# Patient Record
Sex: Female | Born: 1995 | Race: Black or African American | Hispanic: No | Smoking: Current some day smoker
Health system: Southern US, Community
[De-identification: ages and names within clinical notes are randomized; demographics above are authoritative.]

## PROBLEM LIST (undated history)

## (undated) DIAGNOSIS — E119 Type 2 diabetes mellitus without complications: Secondary | ICD-10-CM

## (undated) DIAGNOSIS — R87629 Unspecified abnormal cytological findings in specimens from vagina: Secondary | ICD-10-CM

## (undated) DIAGNOSIS — Z789 Other specified health status: Secondary | ICD-10-CM

## (undated) HISTORY — DX: Unspecified abnormal cytological findings in specimens from vagina: R87.629

## (undated) HISTORY — DX: Type 2 diabetes mellitus without complications: E11.9

## (undated) HISTORY — PX: WISDOM TOOTH EXTRACTION: SHX21

## (undated) HISTORY — PX: NO PAST SURGERIES: SHX2092

---
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
## 2015-12-22 ENCOUNTER — Ambulatory Visit (INDEPENDENT_AMBULATORY_CARE_PROVIDER_SITE_OTHER): Payer: BLUE CROSS/BLUE SHIELD

## 2015-12-22 ENCOUNTER — Ambulatory Visit (HOSPITAL_COMMUNITY): Payer: BLUE CROSS/BLUE SHIELD

## 2015-12-22 ENCOUNTER — Ambulatory Visit (HOSPITAL_COMMUNITY)
Admission: EM | Admit: 2015-12-22 | Discharge: 2015-12-22 | Disposition: A | Discharge status: Home / Self Care | Payer: BLUE CROSS/BLUE SHIELD | Attending: Emergency Medicine | Admitting: Emergency Medicine

## 2015-12-22 ENCOUNTER — Encounter (HOSPITAL_COMMUNITY): Payer: Self-pay | Admitting: Emergency Medicine

## 2015-12-22 DIAGNOSIS — S93491A Sprain of other ligament of right ankle, initial encounter: Secondary | ICD-10-CM

## 2015-12-22 MED ORDER — NAPROXEN 500 MG PO TABS: 500.0000 mg | ORAL_TABLET | Freq: Two times a day (BID) | ORAL | 0 refills | Status: DC | PRN

## 2015-12-22 NOTE — ED Triage Notes (Signed)
Stepped in a hole yesterday and today pain has increased significantly.  Pain in foot and ankle.  Reports numbness in toes.

## 2015-12-22 NOTE — Discharge Instructions (Signed)
Keep ankle in ankle brace for support. Elevated foot and apply ice tonight. Use crutches for support. Take Naproxen twice a day as needed for pain and swelling. Follow-up in 3 days if not improving.

## 2015-12-22 NOTE — ED Provider Notes (Signed)
CSN: [id number]     Arrival date & time 12/22/15  1455 History   None    Chief Complaint  Patient presents with  . Ankle Pain   (Consider location/radiation/quality/duration/timing/severity/associated sxs/prior Treatment) 20 year old female presents with right ankle and foot pain after injury yesterday evening. She stepped in a hole yesterday and twisted her ankle. Pain and swelling have increased today and unable to bear weight. Has not taken any medication for pain. No previous injury to her foot/ankle. No chronic health issues.    The history is provided by the patient.    History reviewed. No pertinent past medical history. History reviewed. No pertinent surgical history. No family history on file. Social History  Substance Use Topics  . Smoking status: Current Some Day Smoker  . Smokeless tobacco: Never Used  . Alcohol use Yes   OB History    No data available     Review of Systems  Constitutional: Negative for fever.  Musculoskeletal: Positive for arthralgias and joint swelling.  Skin: Positive for color change. Negative for rash.  Allergic/Immunologic: Negative for immunocompromised state.  Neurological: Negative for dizziness, weakness, light-headedness, numbness and headaches.  Hematological: Does not bruise/bleed easily.    Allergies  Review of patient's allergies indicates no known allergies.  Home Medications   Prior to Admission medications   Medication Sig Start Date End Date Taking? Authorizing Provider  naproxen (NAPROSYN) 500 MG tablet Take 1 tablet (500 mg total) by mouth 2 (two) times daily as needed. 12/22/15   Sudie GrumblingAnn Berry Shakeda Pearse, NP  TRI-LO-SPRINTEC 0.18/0.215/0.25 MG-25 MCG tab Take 1 tablet by mouth daily. 11/15/14   Historical Provider, MD   Meds Ordered and Administered this Visit  Medications - No data to display  BP 123/60 (BP Location: Right Arm)   Pulse 73   Temp 98.6 F (37 C) (Oral)   LMP 12/10/2015   SpO2 100%  No data  found.   Physical Exam  Constitutional: She is oriented to person, place, and time. She appears well-developed and well-nourished. No distress.  Cardiovascular: Normal rate and regular rhythm.   Musculoskeletal: She exhibits edema and tenderness.       Right ankle: She exhibits decreased range of motion, swelling and ecchymosis. She exhibits no deformity, no laceration and normal pulse. Tenderness. Lateral malleolus tenderness found. Achilles tendon normal.       Feet:  Has decreased range of motion of right ankle and foot, particularly with flexion and extension. Tender below the lateral malleolus and dorsal aspect of foot. Minimal bruising seen. Good pulses and capillary refill. No numbness of toes or neuro deficits noted.   Neurological: She is alert and oriented to person, place, and time. She has normal strength. No sensory deficit.  Skin: Skin is warm and dry. Capillary refill takes less than 2 seconds.  Psychiatric: She has a normal mood and affect. Her behavior is normal. Judgment and thought content normal.    Urgent Care Course   Clinical Course    Procedures (including critical care time)  Labs Review Labs Reviewed - No data to display  Imaging Review Dg Ankle Complete Right  Result Date: 12/22/2015 CLINICAL DATA:  Stepped in hole today. Right ankle injury and pain. Initial encounter. EXAM: RIGHT ANKLE - COMPLETE 3+ VIEW COMPARISON:  None. FINDINGS: There is no evidence of fracture, dislocation, or joint effusion. There is no evidence of arthropathy or other focal bone abnormality. Soft tissues are unremarkable. IMPRESSION: Negative. Electronically Signed   By: Jonny RuizJohn  Eppie GibsonStahl M.D.   On: 12/22/2015 17:15     Visual Acuity Review  Right Eye Distance:   Left Eye Distance:   Bilateral Distance:    Right Eye Near:   Left Eye Near:    Bilateral Near:         MDM   1. Sprain of anterior talofibular ligament of right ankle, initial encounter    Recommend wear ankle  brace and use crutches for support. Take Naproxen 500mg  twice a day as needed for pain and swelling. Elevated foot as much as possible. Follow-up in 4 to 5 days if not improving.       Sudie GrumblingAnn Berry Chriss Mannan, NP 12/23/15 [phone]

## 2016-03-13 ENCOUNTER — Encounter (HOSPITAL_COMMUNITY): Payer: Self-pay

## 2016-03-13 ENCOUNTER — Emergency Department (HOSPITAL_COMMUNITY)
Admission: EM | Admit: 2016-03-13 | Discharge: 2016-03-13 | Disposition: A | Discharge status: Home / Self Care | Payer: BLUE CROSS/BLUE SHIELD | Attending: Emergency Medicine | Admitting: Emergency Medicine

## 2016-03-13 DIAGNOSIS — J069 Acute upper respiratory infection, unspecified: Secondary | ICD-10-CM | POA: Diagnosis not present

## 2016-03-13 DIAGNOSIS — F17200 Nicotine dependence, unspecified, uncomplicated: Secondary | ICD-10-CM | POA: Diagnosis not present

## 2016-03-13 DIAGNOSIS — Z79899 Other long term (current) drug therapy: Secondary | ICD-10-CM | POA: Diagnosis not present

## 2016-03-13 DIAGNOSIS — R509 Fever, unspecified: Secondary | ICD-10-CM | POA: Diagnosis present

## 2016-03-13 MED ORDER — ONDANSETRON 4 MG PO TBDP: ORAL_TABLET | ORAL | 0 refills | Status: DC

## 2016-03-13 MED ORDER — ONDANSETRON 4 MG PO TBDP
4.0000 mg | ORAL_TABLET | Freq: Once | ORAL | Status: AC
  Administered 2016-03-13: 4 mg via ORAL
  Filled 2016-03-13: qty 1

## 2016-03-13 MED ORDER — PROMETHAZINE-DM 6.25-15 MG/5ML PO SYRP: 5.0000 mL | ORAL_SOLUTION | Freq: Four times a day (QID) | ORAL | 0 refills | Status: DC | PRN

## 2016-03-13 MED ORDER — ACETAMINOPHEN 325 MG PO TABS
650.0000 mg | ORAL_TABLET | Freq: Once | ORAL | Status: AC
Start: 2016-03-13 — End: 2016-03-13
  Administered 2016-03-13: 650 mg via ORAL
  Filled 2016-03-13: qty 2

## 2016-03-13 NOTE — ED Notes (Signed)
See EDP assessment 

## 2016-03-13 NOTE — ED Provider Notes (Signed)
[facility]-EMERGENCY DEPT Provider Note    By signing my name below, I, Earmon PhoenixJennifer Waddell, attest that this documentation has been prepared under the direction and in the presence of Felicie Mornavid Lawrence Roldan, FNP. Electronically Signed: Earmon PhoenixJennifer Waddell, ED Scribe. 03/13/16. 9:29 PM.    History   Chief Complaint Chief Complaint  Patient presents with  . flu    The history is provided by the patient and medical records. No language interpreter was used.    Sharnetta Claudette LawsWatson is an obese 21 y.o. female who presents to the Emergency Department complaining of subjective fever and chills that began last night. She reports associated sore throat, mildly productive cough, nausea, vomiting x 3 episodes today, abdominal pain, generalized body aches, nasal congestion and HA. She has not taken anything for her symptoms. Pt reports sick contacts from work. Pt denies modifying factors. She denies difficulty breathing or swallowing. She does not have a PCP locally.   History reviewed. No pertinent past medical history.  There are no active problems to display for this patient.   History reviewed. No pertinent surgical history.  OB History    No data available       Home Medications    Prior to Admission medications   Medication Sig Start Date End Date Taking? Authorizing Provider  naproxen (NAPROSYN) 500 MG tablet Take 1 tablet (500 mg total) by mouth 2 (two) times daily as needed. 12/22/15   Sudie GrumblingAnn Berry Amyot, NP  TRI-LO-SPRINTEC 0.18/0.215/0.25 MG-25 MCG tab Take 1 tablet by mouth daily. 11/15/14   Historical Provider, MD    Family History No family history on file.  Social History Social History  Substance Use Topics  . Smoking status: Current Some Day Smoker  . Smokeless tobacco: Never Used  . Alcohol use Yes     Allergies   Patient has no known allergies.   Review of Systems Review of Systems  Constitutional: Positive for chills and fever (subjective).  HENT: Positive for congestion and sore  throat. Negative for trouble swallowing.   Respiratory: Positive for cough. Negative for shortness of breath.   Gastrointestinal: Positive for abdominal pain, nausea and vomiting.  Musculoskeletal: Positive for myalgias.  Neurological: Positive for headaches.  All other systems reviewed and are negative.    Physical Exam Updated Vital Signs BP 127/75 (BP Location: Left Arm)   Pulse 75   Temp 99.3 F (37.4 C) (Oral)   Resp 20   Ht 5\' 2"  (1.575 m)   Wt 188 lb 6 oz (85.4 kg)   LMP 03/04/2016   SpO2 100%   BMI 34.45 kg/m   Physical Exam  Constitutional: She is oriented to person, place, and time. She appears well-developed and well-nourished.  HENT:  Head: Normocephalic and atraumatic.  Right Ear: Tympanic membrane and ear canal normal.  Left Ear: Tympanic membrane and ear canal normal.  Nose: Rhinorrhea present.  Mouth/Throat: Uvula is midline and mucous membranes are normal. Posterior oropharyngeal erythema present.  Neck: Normal range of motion.  Cardiovascular: Normal rate, regular rhythm and normal heart sounds.  Exam reveals no gallop and no friction rub.   No murmur heard. Pulmonary/Chest: Effort normal and breath sounds normal. No respiratory distress. She has no wheezes. She has no rales.  Musculoskeletal: Normal range of motion.  Neurological: She is alert and oriented to person, place, and time.  Skin: Skin is warm and dry.  Psychiatric: She has a normal mood and affect. Her behavior is normal.  Nursing note and vitals reviewed.  ED Treatments / Results  DIAGNOSTIC STUDIES: Oxygen Saturation is 100% on RA, normal by my interpretation.   COORDINATION OF CARE: 9:25 PM- Will treat symptomatically. Advised pt to take OTC Tylenol or Motrin for fever/body aches. Return precautions discussed. Pt verbalizes understanding and agrees to plan.  Medications - No data to display  Labs (all labs ordered are listed, but only abnormal results are displayed) Labs  Reviewed - No data to display  EKG  EKG Interpretation None       Radiology No results found.  Procedures Procedures (including critical care time)  Medications Ordered in ED Medications - No data to display   Initial Impression / Assessment and Plan / ED Course  I have reviewed the triage vital signs and the nursing notes.  Pertinent labs & imaging results that were available during my care of the patient were reviewed by me and considered in my medical decision making (see chart for details).     Pt symptoms consistent with URI. Pt will be discharged with symptomatic treatment.  Discussed return precautions. Pt is hemodynamically stable & in NAD prior to discharge.   I personally performed the services described in this documentation, which was scribed in my presence. The recorded information has been reviewed and is accurate.    Final Clinical Impressions(s) / ED Diagnoses   Final diagnoses:  Viral URI    New Prescriptions Discharge Medication List as of 03/13/2016 10:05 PM    START taking these medications   Details  ondansetron (ZOFRAN ODT) 4 MG disintegrating tablet 4mg  ODT q4 hours prn nausea/vomit, Print    promethazine-dextromethorphan (PROMETHAZINE-DM) 6.25-15 MG/5ML syrup Take 5 mLs by mouth 4 (four) times daily as needed for cough., Starting Thu 03/13/2016, Print         Felicie Morn, NP 03/13/16 2317    Linwood Dibbles, MD 03/14/16 1247

## 2016-03-13 NOTE — ED Triage Notes (Signed)
Pt states that last night she started having chills, fevers, sore throat, abd pain, headache and vomiting.

## 2016-10-15 ENCOUNTER — Encounter (HOSPITAL_COMMUNITY): Payer: Self-pay | Admitting: Emergency Medicine

## 2016-10-15 ENCOUNTER — Emergency Department (HOSPITAL_COMMUNITY)
Admission: EM | Admit: 2016-10-15 | Discharge: 2016-10-15 | Disposition: A | Discharge status: Home / Self Care | Payer: BLUE CROSS/BLUE SHIELD | Attending: Emergency Medicine | Admitting: Emergency Medicine

## 2016-10-15 DIAGNOSIS — Z5321 Procedure and treatment not carried out due to patient leaving prior to being seen by health care provider: Secondary | ICD-10-CM | POA: Diagnosis not present

## 2016-10-15 DIAGNOSIS — R1030 Lower abdominal pain, unspecified: Secondary | ICD-10-CM | POA: Insufficient documentation

## 2016-10-15 LAB — URINALYSIS, ROUTINE W REFLEX MICROSCOPIC
BILIRUBIN URINE: NEGATIVE
Glucose, UA: NEGATIVE mg/dL
HGB URINE DIPSTICK: NEGATIVE
KETONES UR: 5 mg/dL — AB
Leukocytes, UA: NEGATIVE
NITRITE: NEGATIVE
PROTEIN: NEGATIVE mg/dL
Specific Gravity, Urine: 1.027 (ref 1.005–1.030)
pH: 5 (ref 5.0–8.0)

## 2016-10-15 LAB — COMPREHENSIVE METABOLIC PANEL
ALBUMIN: 3.4 g/dL — AB (ref 3.5–5.0)
ALK PHOS: 59 U/L (ref 38–126)
ALT: 21 U/L (ref 14–54)
ANION GAP: 8 (ref 5–15)
AST: 23 U/L (ref 15–41)
BILIRUBIN TOTAL: 0.6 mg/dL (ref 0.3–1.2)
BUN: 13 mg/dL (ref 6–20)
CALCIUM: 9 mg/dL (ref 8.9–10.3)
CO2: 23 mmol/L (ref 22–32)
CREATININE: 0.74 mg/dL (ref 0.44–1.00)
Chloride: 104 mmol/L (ref 101–111)
GFR calc non Af Amer: >60 mL/min (ref >60)
GLUCOSE: 86 mg/dL (ref 65–99)
Potassium: 3.6 mmol/L (ref 3.5–5.1)
SODIUM: 135 mmol/L (ref 135–145)
TOTAL PROTEIN: 7.2 g/dL (ref 6.5–8.1)

## 2016-10-15 LAB — CBC
HCT: 35.6 % — ABNORMAL LOW (ref 36.0–46.0)
Hemoglobin: 11.6 g/dL — ABNORMAL LOW (ref 12.0–15.0)
MCH: 28.2 pg (ref 26.0–34.0)
MCHC: 32.6 g/dL (ref 30.0–36.0)
MCV: 86.6 fL (ref 78.0–100.0)
PLATELETS: 250 10*3/uL (ref 150–400)
RBC: 4.11 MIL/uL (ref 3.87–5.11)
RDW: 14.7 % (ref 11.5–15.5)
WBC: 9.7 10*3/uL (ref 4.0–10.5)

## 2016-10-15 LAB — HCG, QUANTITATIVE, PREGNANCY: HCG, BETA CHAIN, QUANT, S: 385 m[IU]/mL — AB (ref <5)

## 2016-10-15 LAB — LIPASE, BLOOD: Lipase: 30 U/L (ref 11–51)

## 2016-10-15 NOTE — ED Notes (Signed)
Patient up to desk.  States she does not want to wait.  This RN asked if she could review patient's chart, patient' states "no thank you" and left through front doors.

## 2016-10-15 NOTE — ED Notes (Signed)
Called for pt x3 with no response 

## 2016-10-15 NOTE — ED Triage Notes (Signed)
Reports n/v/d for three days as well as lower abdominal cramping.  States I'm not sure if I am pregnant or not.

## 2016-10-16 ENCOUNTER — Emergency Department (HOSPITAL_COMMUNITY): Payer: BLUE CROSS/BLUE SHIELD

## 2016-10-16 ENCOUNTER — Encounter (HOSPITAL_COMMUNITY): Payer: Self-pay

## 2016-10-16 ENCOUNTER — Emergency Department (HOSPITAL_COMMUNITY)
Admission: EM | Admit: 2016-10-16 | Discharge: 2016-10-16 | Disposition: A | Discharge status: Home / Self Care | Payer: BLUE CROSS/BLUE SHIELD | Attending: Physician Assistant | Admitting: Physician Assistant

## 2016-10-16 DIAGNOSIS — Z7983 Long term (current) use of bisphosphonates: Secondary | ICD-10-CM | POA: Diagnosis not present

## 2016-10-16 DIAGNOSIS — O26891 Other specified pregnancy related conditions, first trimester: Secondary | ICD-10-CM | POA: Diagnosis not present

## 2016-10-16 DIAGNOSIS — Z79899 Other long term (current) drug therapy: Secondary | ICD-10-CM | POA: Diagnosis not present

## 2016-10-16 DIAGNOSIS — F17210 Nicotine dependence, cigarettes, uncomplicated: Secondary | ICD-10-CM | POA: Diagnosis not present

## 2016-10-16 DIAGNOSIS — Z3A01 Less than 8 weeks gestation of pregnancy: Secondary | ICD-10-CM | POA: Insufficient documentation

## 2016-10-16 DIAGNOSIS — R1030 Lower abdominal pain, unspecified: Secondary | ICD-10-CM | POA: Diagnosis present

## 2016-10-16 DIAGNOSIS — R109 Unspecified abdominal pain: Secondary | ICD-10-CM

## 2016-10-16 LAB — CBC
HEMATOCRIT: 36.5 % (ref 36.0–46.0)
HEMOGLOBIN: 11.9 g/dL — AB (ref 12.0–15.0)
MCH: 28.5 pg (ref 26.0–34.0)
MCHC: 32.6 g/dL (ref 30.0–36.0)
MCV: 87.3 fL (ref 78.0–100.0)
Platelets: 239 10*3/uL (ref 150–400)
RBC: 4.18 MIL/uL (ref 3.87–5.11)
RDW: 14.6 % (ref 11.5–15.5)
WBC: 9.4 10*3/uL (ref 4.0–10.5)

## 2016-10-16 LAB — COMPREHENSIVE METABOLIC PANEL
ALT: 20 U/L (ref 14–54)
ANION GAP: 8 (ref 5–15)
AST: 23 U/L (ref 15–41)
Albumin: 3.2 g/dL — ABNORMAL LOW (ref 3.5–5.0)
Alkaline Phosphatase: 62 U/L (ref 38–126)
BILIRUBIN TOTAL: 0.5 mg/dL (ref 0.3–1.2)
BUN: 10 mg/dL (ref 6–20)
CHLORIDE: 106 mmol/L (ref 101–111)
CO2: 25 mmol/L (ref 22–32)
Calcium: 9.2 mg/dL (ref 8.9–10.3)
Creatinine, Ser: 0.61 mg/dL (ref 0.44–1.00)
GFR calc Af Amer: >60 mL/min (ref >60)
Glucose, Bld: 94 mg/dL (ref 65–99)
POTASSIUM: 3.6 mmol/L (ref 3.5–5.1)
Sodium: 139 mmol/L (ref 135–145)
TOTAL PROTEIN: 7.2 g/dL (ref 6.5–8.1)

## 2016-10-16 LAB — URINALYSIS, ROUTINE W REFLEX MICROSCOPIC
Bilirubin Urine: NEGATIVE
GLUCOSE, UA: NEGATIVE mg/dL
Hgb urine dipstick: NEGATIVE
KETONES UR: 5 mg/dL — AB
LEUKOCYTES UA: NEGATIVE
NITRITE: NEGATIVE
PH: 5 (ref 5.0–8.0)
Protein, ur: NEGATIVE mg/dL
SPECIFIC GRAVITY, URINE: 1.027 (ref 1.005–1.030)

## 2016-10-16 LAB — I-STAT BETA HCG BLOOD, ED (MC, WL, AP ONLY): I-stat hCG, quantitative: 451.7 m[IU]/mL — ABNORMAL HIGH (ref <5)

## 2016-10-16 LAB — ABO/RH: ABO/RH(D): A POS

## 2016-10-16 LAB — HCG, QUANTITATIVE, PREGNANCY: hCG, Beta Chain, Quant, S: 517 m[IU]/mL — ABNORMAL HIGH (ref <5)

## 2016-10-16 MED ORDER — PRENATAL COMPLETE 14-0.4 MG PO TABS: 1.0000 | ORAL_TABLET | ORAL | 0 refills | Status: AC

## 2016-10-16 NOTE — Discharge Instructions (Signed)
Please read attached information. If you experience any new or worsening signs or symptoms please return to the emergency room for evaluation. Please follow-up with your primary care provider or specialist as discussed. Please use medication prescribed only as directed and discontinue taking if you have any concerning signs or symptoms.   °

## 2016-10-16 NOTE — ED Provider Notes (Signed)
[facility]-EMERGENCY DEPT Provider Note   CSN: [id number] Arrival date & time: 10/16/16  1241     History   Chief Complaint Chief Complaint  Patient presents with  . Possible Pregnancy    HPI Yolanda Martinez is a 21 y.o. female.  HPI   21 year old female presents today with complaints of abdominal cramping and potential pregnancy.  Patient is a poor historian, she is unable to recall her last menstrual cycle.  She is not sure if it was in June or July.  Patient notes that 4 days ago she started developing abdominal cramping in the lower abdomen and pelvis.  Patient notes she has been feeling nauseous, but has not had any vomiting today.  Patient did have vomiting originally when symptoms started.  Patient denies any vaginal discharge or bleeding.  Patient not taking any medications.    History reviewed. No pertinent past medical history.  There are no active problems to display for this patient.   History reviewed. No pertinent surgical history.  OB History    No data available       Home Medications    Prior to Admission medications   Medication Sig Start Date End Date Taking? Authorizing Provider  naproxen (NAPROSYN) 500 MG tablet Take 1 tablet (500 mg total) by mouth 2 (two) times daily as needed. 12/22/15   Sudie Grumbling, NP  ondansetron (ZOFRAN ODT) 4 MG disintegrating tablet 4mg  ODT q4 hours prn nausea/vomit 03/13/16   Felicie Morn, NP  Prenatal Vit-Fe Fumarate-FA (PRENATAL COMPLETE) 14-0.4 MG TABS Take 1 tablet by mouth 1 day or 1 dose. 10/16/16 10/17/16  Idania Desouza, Tinnie Gens, PA-C  promethazine-dextromethorphan (PROMETHAZINE-DM) 6.25-15 MG/5ML syrup Take 5 mLs by mouth 4 (four) times daily as needed for cough. 03/13/16   Felicie Morn, NP  TRI-LO-SPRINTEC 0.18/0.215/0.25 MG-25 MCG tab Take 1 tablet by mouth daily. 11/15/14   [provider]    Family History History reviewed. No pertinent family history.  Social History Social History  Substance Use Topics  .  Smoking status: Current Some Day Smoker  . Smokeless tobacco: Never Used  . Alcohol use Yes     Allergies   Patient has no known allergies.   Review of Systems Review of Systems  All other systems reviewed and are negative.    Physical Exam Updated Vital Signs BP 122/77 (BP Location: Right Arm)   Pulse 88   Temp 98.5 F (36.9 C) (Oral)   Resp 18   LMP 08/17/2016 Comment: Unsure of LMP  SpO2 100%   Physical Exam  Constitutional: She is oriented to person, place, and time. She appears well-developed and well-nourished.  HENT:  Head: Normocephalic and atraumatic.  Eyes: Pupils are equal, round, and reactive to light. Conjunctivae are normal. Right eye exhibits no discharge. Left eye exhibits no discharge. No scleral icterus.  Neck: Normal range of motion. No JVD present. No tracheal deviation present.  Pulmonary/Chest: Effort normal. No stridor.  Abdominal: Soft. She exhibits no distension and no mass. There is tenderness. There is no rebound and no guarding. No hernia.  Minor tenderness palpation of the suprapubic and pelvic region  Neurological: She is alert and oriented to person, place, and time. Coordination normal.  Psychiatric: She has a normal mood and affect. Her behavior is normal. Judgment and thought content normal.  Nursing note and vitals reviewed.    ED Treatments / Results  Labs (all labs ordered are listed, but only abnormal results are displayed) Labs Reviewed  COMPREHENSIVE METABOLIC PANEL -  Abnormal; Notable for the following:       Result Value   Albumin 3.2 (*)    All other components within normal limits  CBC - Abnormal; Notable for the following:    Hemoglobin 11.9 (*)    All other components within normal limits  URINALYSIS, ROUTINE W REFLEX MICROSCOPIC - Abnormal; Notable for the following:    APPearance HAZY (*)    Ketones, ur 5 (*)    All other components within normal limits  HCG, QUANTITATIVE, PREGNANCY - Abnormal; Notable for the  following:    hCG, Beta Chain, Quant, S 517 (*)    All other components within normal limits  I-STAT BETA HCG BLOOD, ED ([facility], [facility], [facility] ONLY) - Abnormal; Notable for the following:    I-stat hCG, quantitative 451.7 (*)    All other components within normal limits  ABO/RH    EKG  EKG Interpretation None       Radiology US Ob Comp Less 14 Wks  Result Date: 10/16/2016 CLINICAL DATA:  Pelvic pain.  Pregnant patient. EXAM: OBSTETRIC <14 WK [place] AND TRANSVAGINAL OB US TECHNIQUE: Both transabdominal and transvaginal ultrasound examinations were performed for complete evaluation of the gestation as well as the maternal uterus, adnexal regions, and pelvic cul-de-sac. Transvaginal technique was performed to assess early pregnancy. COMPARISON:  None. FINDINGS: Intrauterine gestational sac: There is a possible early gestational sac which is too small to completely characterize. Yolk sac:  Not Visualized. Embryo:  Not Visualized. MSD: 2.1  mm   4 w   6  d CRL:    mm    w    d                  [place] EDC: Subchorionic hemorrhage:  None visualized. Maternal uterus/adnexae: Probable corpus luteum cyst on the left. Normal right ovary. No fluid in the cul-de-sac. IMPRESSION: 1. No intrauterine pregnancy is confirmed. However, the tiny fluid collection with a mean diameter of 2.1 mm in the endometrium may represent an early gestational sac. Recommend clinical correlation and close follow-up. Electronically Signed   By: Gerome Sam III M.D   On: 10/16/2016 20:11   US Ob Transvaginal  Result Date: 10/16/2016 CLINICAL DATA:  Pelvic pain.  Pregnant patient. EXAM: OBSTETRIC <14 WK [place] AND TRANSVAGINAL OB US TECHNIQUE: Both transabdominal and transvaginal ultrasound examinations were performed for complete evaluation of the gestation as well as the maternal uterus, adnexal regions, and pelvic cul-de-sac. Transvaginal technique was performed to assess early pregnancy. COMPARISON:  None. FINDINGS: Intrauterine gestational sac:  There is a possible early gestational sac which is too small to completely characterize. Yolk sac:  Not Visualized. Embryo:  Not Visualized. MSD: 2.1  mm   4 w   6  d CRL:    mm    w    d                  [place] EDC: Subchorionic hemorrhage:  None visualized. Maternal uterus/adnexae: Probable corpus luteum cyst on the left. Normal right ovary. No fluid in the cul-de-sac. IMPRESSION: 1. No intrauterine pregnancy is confirmed. However, the tiny fluid collection with a mean diameter of 2.1 mm in the endometrium may represent an early gestational sac. Recommend clinical correlation and close follow-up. Electronically Signed   By: Gerome Sam III M.D   On: 10/16/2016 20:11    Procedures Procedures (including critical care time)  Medications Ordered in ED Medications - No data to display  Initial Impression / Assessment and Plan / ED Course  I have reviewed the triage vital signs and the nursing notes.  Pertinent labs & imaging results that were available during my care of the patient were reviewed by me and considered in my medical decision making (see chart for details).      Final Clinical Impressions(s) / ED Diagnoses   Final diagnoses:  Abdominal pain  Less than [redacted] weeks gestation of pregnancy    21 year old female presents today with reports of pregnancy.  Patient's hCG quant is 517, she is unable to tell me when her last menstrual cycle is.  Proceed with ultrasound as was uncertain if this hCG is trending up or trending down.  Patient is likely early in the pregnancy, she has no vaginal bleeding, appears very comfortable here.  No intrauterine pregnancy confirmed.  [facility], she is given strict return precautions, she verbalized understanding and agreement to today's plan.   New Prescriptions Discharge Medication List as of 10/16/2016  8:40 PM    START taking these medications   Details  Prenatal Vit-Fe Fumarate-FA (PRENATAL  COMPLETE) 14-0.4 MG TABS Take 1 tablet by mouth 1 day or 1 dose., Starting Thu 10/16/2016, Until Fri 10/17/2016, Print         China Deitrick, Tinnie GensJeffrey, PA-C 10/16/16 2120    Abelino DerrickMackuen, Courteney Lyn, MD 10/16/16 2312

## 2016-10-16 NOTE — ED Triage Notes (Signed)
Pt states she is concerned she could be pregnant. LMP was last month but she does not remember when. Pt reports lower abd pain. Pt reports nausea and vomiting as well.

## 2016-11-17 ENCOUNTER — Other Ambulatory Visit (HOSPITAL_COMMUNITY): Payer: Self-pay | Admitting: Nurse Practitioner

## 2016-11-17 DIAGNOSIS — Z3A13 13 weeks gestation of pregnancy: Secondary | ICD-10-CM

## 2016-11-17 DIAGNOSIS — Z3682 Encounter for antenatal screening for nuchal translucency: Secondary | ICD-10-CM

## 2016-11-17 LAB — OB RESULTS CONSOLE ABO/RH: RH TYPE: POSITIVE

## 2016-11-17 LAB — OB RESULTS CONSOLE ANTIBODY SCREEN: Antibody Screen: NEGATIVE

## 2016-11-17 LAB — OB RESULTS CONSOLE RUBELLA ANTIBODY, IGM: RUBELLA: IMMUNE

## 2016-11-17 LAB — OB RESULTS CONSOLE GC/CHLAMYDIA
Chlamydia: NEGATIVE
Gonorrhea: NEGATIVE

## 2016-11-17 LAB — OB RESULTS CONSOLE RPR: RPR: NONREACTIVE

## 2016-11-17 LAB — OB RESULTS CONSOLE HIV ANTIBODY (ROUTINE TESTING): HIV: NONREACTIVE

## 2016-11-17 LAB — OB RESULTS CONSOLE HEPATITIS B SURFACE ANTIGEN: Hepatitis B Surface Ag: NEGATIVE

## 2016-11-20 ENCOUNTER — Encounter: Payer: BLUE CROSS/BLUE SHIELD | Admitting: Certified Nurse Midwife

## 2016-12-04 ENCOUNTER — Encounter (HOSPITAL_COMMUNITY): Payer: Self-pay | Admitting: Nurse Practitioner

## 2016-12-11 ENCOUNTER — Encounter (HOSPITAL_COMMUNITY): Payer: Self-pay | Admitting: *Deleted

## 2016-12-12 ENCOUNTER — Encounter (HOSPITAL_COMMUNITY): Payer: Self-pay

## 2016-12-12 ENCOUNTER — Ambulatory Visit (HOSPITAL_COMMUNITY)
Admission: RE | Admit: 2016-12-12 | Discharge: 2016-12-12 | Disposition: A | Discharge status: Home / Self Care | Payer: BLUE CROSS/BLUE SHIELD | Source: Ambulatory Visit | Attending: Nurse Practitioner | Admitting: Nurse Practitioner

## 2016-12-12 DIAGNOSIS — Z3682 Encounter for antenatal screening for nuchal translucency: Secondary | ICD-10-CM | POA: Insufficient documentation

## 2016-12-12 DIAGNOSIS — Z3A13 13 weeks gestation of pregnancy: Secondary | ICD-10-CM

## 2016-12-12 DIAGNOSIS — Z3A12 12 weeks gestation of pregnancy: Secondary | ICD-10-CM | POA: Insufficient documentation

## 2016-12-12 DIAGNOSIS — Z3687 Encounter for antenatal screening for uncertain dates: Secondary | ICD-10-CM | POA: Diagnosis not present

## 2016-12-12 HISTORY — DX: Other specified health status: Z78.9

## 2016-12-16 NOTE — Addendum Note (Signed)
Encounter addended by: Heidi Dachobb, Brexlee Heberlein A, RN on: 12/16/2016 11:29 AM<BR>    Actions taken: OB Dating navigator section updated

## 2016-12-19 ENCOUNTER — Other Ambulatory Visit (HOSPITAL_COMMUNITY): Payer: Self-pay

## 2017-05-14 LAB — OB RESULTS CONSOLE GBS: GBS: POSITIVE

## 2017-05-14 LAB — OB RESULTS CONSOLE GC/CHLAMYDIA
Chlamydia: NEGATIVE
Gonorrhea: NEGATIVE

## 2017-06-11 ENCOUNTER — Inpatient Hospital Stay (HOSPITAL_COMMUNITY)
Admission: AD | Admit: 2017-06-11 | Discharge: 2017-06-11 | Disposition: A | Discharge status: Home / Self Care | Payer: BLUE CROSS/BLUE SHIELD | Source: Ambulatory Visit | Attending: Obstetrics and Gynecology | Admitting: Obstetrics and Gynecology

## 2017-06-11 ENCOUNTER — Encounter (HOSPITAL_COMMUNITY): Payer: Self-pay

## 2017-06-11 ENCOUNTER — Other Ambulatory Visit: Payer: Self-pay

## 2017-06-11 DIAGNOSIS — O471 False labor at or after 37 completed weeks of gestation: Secondary | ICD-10-CM | POA: Insufficient documentation

## 2017-06-11 DIAGNOSIS — O479 False labor, unspecified: Secondary | ICD-10-CM

## 2017-06-11 DIAGNOSIS — Z3A40 40 weeks gestation of pregnancy: Secondary | ICD-10-CM | POA: Diagnosis not present

## 2017-06-11 NOTE — Discharge Instructions (Signed)
Braxton Hicks Contractions °Contractions of the uterus can occur throughout pregnancy, but they are not always a sign that you are in labor. You may have practice contractions called Braxton Hicks contractions. These false labor contractions are sometimes confused with true labor. °What are Braxton Hicks contractions? °Braxton Hicks contractions are tightening movements that occur in the muscles of the uterus before labor. Unlike true labor contractions, these contractions do not result in opening (dilation) and thinning of the cervix. Toward the end of pregnancy (32-34 weeks), Braxton Hicks contractions can happen more often and may become stronger. These contractions are sometimes difficult to tell apart from true labor because they can be very uncomfortable. You should not feel embarrassed if you go to the hospital with false labor. °Sometimes, the only way to tell if you are in true labor is for your health care provider to look for changes in the cervix. The health care provider will do a physical exam and may monitor your contractions. If you are not in true labor, the exam should show that your cervix is not dilating and your water has not broken. °If there are other health problems associated with your pregnancy, it is completely safe for you to be sent home with false labor. You may continue to have Braxton Hicks contractions until you go into true labor. °How to tell the difference between true labor and false labor °True labor °· Contractions last 30-70 seconds. °· Contractions become very regular. °· Discomfort is usually felt in the top of the uterus, and it spreads to the lower abdomen and low back. °· Contractions do not go away with walking. °· Contractions usually become more intense and increase in frequency. °· The cervix dilates and gets thinner. °False labor °· Contractions are usually shorter and not as strong as true labor contractions. °· Contractions are usually irregular. °· Contractions  are often felt in the front of the lower abdomen and in the groin. °· Contractions may go away when you walk around or change positions while lying down. °· Contractions get weaker and are shorter-lasting as time goes on. °· The cervix usually does not dilate or become thin. °Follow these instructions at home: °· Take over-the-counter and prescription medicines only as told by your health care provider. °· Keep up with your usual exercises and follow other instructions from your health care provider. °· Eat and drink lightly if you think you are going into labor. °· If Braxton Hicks contractions are making you uncomfortable: °? Change your position from lying down or resting to walking, or change from walking to resting. °? Sit and rest in a tub of warm water. °? Drink enough fluid to keep your urine pale yellow. Dehydration may cause these contractions. °? Do slow and deep breathing several times an hour. °· Keep all follow-up prenatal visits as told by your health care provider. This is important. °Contact a health care provider if: °· You have a fever. °· You have continuous pain in your abdomen. °Get help right away if: °· Your contractions become stronger, more regular, and closer together. °· You have fluid leaking or gushing from your vagina. °· You pass blood-tinged mucus (bloody show). °· You have bleeding from your vagina. °· You have low back pain that you never had before. °· You feel your baby’s head pushing down and causing pelvic pressure. °· Your baby is not moving inside you as much as it used to. °Summary °· Contractions that occur before labor are called Braxton   Hicks contractions, false labor, or practice contractions. °· Braxton Hicks contractions are usually shorter, weaker, farther apart, and less regular than true labor contractions. True labor contractions usually become progressively stronger and regular and they become more frequent. °· Manage discomfort from Braxton Hicks contractions by  changing position, resting in a warm bath, drinking plenty of water, or practicing deep breathing. °This information is not intended to replace advice given to you by your health care provider. Make sure you discuss any questions you have with your health care provider. °Document Released: 06/19/2016 Document Revised: 06/19/2016 Document Reviewed: 06/19/2016 °Elsevier Interactive Patient Education © 2018 [employer] ° °Fetal Movement Counts °Patient Name: ________________________________________________ Patient Due Date: ____________________ °What is a fetal movement count? °A fetal movement count is the number of times that you feel your baby move during a certain amount of time. This may also be called a fetal kick count. A fetal movement count is recommended for every pregnant woman. You may be asked to start counting fetal movements as early as week 28 of your pregnancy. °Pay attention to when your baby is most active. You may notice your baby's sleep and wake cycles. You may also notice things that make your baby move more. You should do a fetal movement count: °· When your baby is normally most active. °· At the same time each day. ° °A good time to count movements is while you are resting, after having something to eat and drink. °How do I count fetal movements? °1. Find a quiet, comfortable area. Sit, or lie down on your side. °2. Write down the date, the start time and stop time, and the number of movements that you felt between those two times. Take this information with you to your health care visits. °3. For 2 hours, count kicks, flutters, swishes, rolls, and jabs. You should feel at least 10 movements during 2 hours. °4. You may stop counting after you have felt 10 movements. °5. If you do not feel 10 movements in 2 hours, have something to eat and drink. Then, keep resting and counting for 1 hour. If you feel at least 4 movements during that hour, you may stop counting. °Contact a health care  provider if: °· You feel fewer than 4 movements in 2 hours. °· Your baby is not moving like he or she usually does. °Date: ____________ Start time: ____________ Stop time: ____________ Movements: ____________ °Date: ____________ Start time: ____________ Stop time: ____________ Movements: ____________ °Date: ____________ Start time: ____________ Stop time: ____________ Movements: ____________ °Date: ____________ Start time: ____________ Stop time: ____________ Movements: ____________ °Date: ____________ Start time: ____________ Stop time: ____________ Movements: ____________ °Date: ____________ Start time: ____________ Stop time: ____________ Movements: ____________ °Date: ____________ Start time: ____________ Stop time: ____________ Movements: ____________ °Date: ____________ Start time: ____________ Stop time: ____________ Movements: ____________ °Date: ____________ Start time: ____________ Stop time: ____________ Movements: ____________ °This information is not intended to replace advice given to you by your health care provider. Make sure you discuss any questions you have with your health care provider. °Document Released: 03/05/2006 Document Revised: 10/03/2015 Document Reviewed: 03/15/2015 °Elsevier Interactive Patient Education © 2018 [employer] ° °

## 2017-06-11 NOTE — MAU Note (Signed)
Pt presents to MAU with c/o ctxs that started tonight at 1930, occurring every 5 min. Pt denies VB and LOF. +FM

## 2017-06-12 ENCOUNTER — Inpatient Hospital Stay (HOSPITAL_COMMUNITY)
Admission: AD | Admit: 2017-06-12 | Discharge: 2017-06-12 | Disposition: A | Discharge status: Home / Self Care | Payer: BLUE CROSS/BLUE SHIELD | Source: Ambulatory Visit | Attending: Obstetrics & Gynecology | Admitting: Obstetrics & Gynecology

## 2017-06-12 ENCOUNTER — Encounter (HOSPITAL_COMMUNITY): Payer: Self-pay

## 2017-06-12 DIAGNOSIS — Z3A40 40 weeks gestation of pregnancy: Secondary | ICD-10-CM | POA: Diagnosis not present

## 2017-06-12 DIAGNOSIS — O471 False labor at or after 37 completed weeks of gestation: Secondary | ICD-10-CM | POA: Diagnosis not present

## 2017-06-12 DIAGNOSIS — O479 False labor, unspecified: Secondary | ICD-10-CM

## 2017-06-12 LAB — POCT FERN TEST: POCT Fern Test: NEGATIVE

## 2017-06-12 MED ORDER — TRAMADOL HCL 50 MG PO TABS
50.0000 mg | ORAL_TABLET | Freq: Once | ORAL | Status: AC
  Administered 2017-06-12: 50 mg via ORAL
  Filled 2017-06-12: qty 1

## 2017-06-12 NOTE — MAU Note (Signed)
Pt feeling contractions since around 5 a.m. 5-10 mins apart. Saw "bloody" liquid in panties when she woke up at 5.  +FM

## 2017-06-12 NOTE — Discharge Instructions (Signed)
Braxton Hicks Contractions °Contractions of the uterus can occur throughout pregnancy, but they are not always a sign that you are in labor. You may have practice contractions called Braxton Hicks contractions. These false labor contractions are sometimes confused with true labor. °What are Braxton Hicks contractions? °Braxton Hicks contractions are tightening movements that occur in the muscles of the uterus before labor. Unlike true labor contractions, these contractions do not result in opening (dilation) and thinning of the cervix. Toward the end of pregnancy (32-34 weeks), Braxton Hicks contractions can happen more often and may become stronger. These contractions are sometimes difficult to tell apart from true labor because they can be very uncomfortable. You should not feel embarrassed if you go to the hospital with false labor. °Sometimes, the only way to tell if you are in true labor is for your health care provider to look for changes in the cervix. The health care provider will do a physical exam and may monitor your contractions. If you are not in true labor, the exam should show that your cervix is not dilating and your water has not broken. °If there are other health problems associated with your pregnancy, it is completely safe for you to be sent home with false labor. You may continue to have Braxton Hicks contractions until you go into true labor. °How to tell the difference between true labor and false labor °True labor °· Contractions last 30-70 seconds. °· Contractions become very regular. °· Discomfort is usually felt in the top of the uterus, and it spreads to the lower abdomen and low back. °· Contractions do not go away with walking. °· Contractions usually become more intense and increase in frequency. °· The cervix dilates and gets thinner. °False labor °· Contractions are usually shorter and not as strong as true labor contractions. °· Contractions are usually irregular. °· Contractions  are often felt in the front of the lower abdomen and in the groin. °· Contractions may go away when you walk around or change positions while lying down. °· Contractions get weaker and are shorter-lasting as time goes on. °· The cervix usually does not dilate or become thin. °Follow these instructions at home: °· Take over-the-counter and prescription medicines only as told by your health care provider. °· Keep up with your usual exercises and follow other instructions from your health care provider. °· Eat and drink lightly if you think you are going into labor. °· If Braxton Hicks contractions are making you uncomfortable: °? Change your position from lying down or resting to walking, or change from walking to resting. °? Sit and rest in a tub of warm water. °? Drink enough fluid to keep your urine pale yellow. Dehydration may cause these contractions. °? Do slow and deep breathing several times an hour. °· Keep all follow-up prenatal visits as told by your health care provider. This is important. °Contact a health care provider if: °· You have a fever. °· You have continuous pain in your abdomen. °Get help right away if: °· Your contractions become stronger, more regular, and closer together. °· You have fluid leaking or gushing from your vagina. °· You have bleeding from your vagina. °· You have low back pain that you never had before. °· You feel your baby’s head pushing down and causing pelvic pressure. °· Your baby is not moving inside you as much as it used to. °Summary °· Contractions that occur before labor are called Braxton Hicks contractions, false labor, or practice contractions. °·   Braxton Hicks contractions are usually shorter, weaker, farther apart, and less regular than true labor contractions. True labor contractions usually become progressively stronger and regular and they become more frequent. °· Manage discomfort from Braxton Hicks contractions by changing position, resting in a warm bath,  drinking plenty of water, or practicing deep breathing. °This information is not intended to replace advice given to you by your health care provider. Make sure you discuss any questions you have with your health care provider. °Document Released: 06/19/2016 Document Revised: 06/19/2016 Document Reviewed: 06/19/2016 °Elsevier Interactive Patient Education © 2018 [employer] ° °

## 2017-06-13 ENCOUNTER — Encounter (HOSPITAL_COMMUNITY): Payer: Self-pay | Admitting: *Deleted

## 2017-06-13 ENCOUNTER — Inpatient Hospital Stay (HOSPITAL_COMMUNITY)
Admission: AD | Admit: 2017-06-13 | Discharge: 2017-06-13 | Disposition: A | Discharge status: Home / Self Care | Payer: BLUE CROSS/BLUE SHIELD | Source: Ambulatory Visit | Attending: Obstetrics and Gynecology | Admitting: Obstetrics and Gynecology

## 2017-06-13 DIAGNOSIS — Z87891 Personal history of nicotine dependence: Secondary | ICD-10-CM | POA: Diagnosis not present

## 2017-06-13 DIAGNOSIS — Z3A38 38 weeks gestation of pregnancy: Secondary | ICD-10-CM | POA: Diagnosis not present

## 2017-06-13 DIAGNOSIS — O471 False labor at or after 37 completed weeks of gestation: Secondary | ICD-10-CM

## 2017-06-13 NOTE — MAU Note (Signed)
Patient started contracting 2 hours ago. Unsure how far apart.  C/o constant back pain.  Reports possible LOF.

## 2017-06-13 NOTE — Progress Notes (Signed)
Yolanda Martinez, CNM discussed labor precautions and fetal kick counts with patient.  Printed AVS given to patient and discharge home in good condition.  Pt verbalizes understanding of DC instructions.

## 2017-06-13 NOTE — Discharge Instructions (Signed)
Braxton Hicks Contractions °Contractions of the uterus can occur throughout pregnancy, but they are not always a sign that you are in labor. You may have practice contractions called Braxton Hicks contractions. These false labor contractions are sometimes confused with true labor. °What are Braxton Hicks contractions? °Braxton Hicks contractions are tightening movements that occur in the muscles of the uterus before labor. Unlike true labor contractions, these contractions do not result in opening (dilation) and thinning of the cervix. Toward the end of pregnancy (32-34 weeks), Braxton Hicks contractions can happen more often and may become stronger. These contractions are sometimes difficult to tell apart from true labor because they can be very uncomfortable. You should not feel embarrassed if you go to the hospital with false labor. °Sometimes, the only way to tell if you are in true labor is for your health care provider to look for changes in the cervix. The health care provider will do a physical exam and may monitor your contractions. If you are not in true labor, the exam should show that your cervix is not dilating and your water has not broken. °If there are other health problems associated with your pregnancy, it is completely safe for you to be sent home with false labor. You may continue to have Braxton Hicks contractions until you go into true labor. °How to tell the difference between true labor and false labor °True labor °· Contractions last 30-70 seconds. °· Contractions become very regular. °· Discomfort is usually felt in the top of the uterus, and it spreads to the lower abdomen and low back. °· Contractions do not go away with walking. °· Contractions usually become more intense and increase in frequency. °· The cervix dilates and gets thinner. °False labor °· Contractions are usually shorter and not as strong as true labor contractions. °· Contractions are usually irregular. °· Contractions  are often felt in the front of the lower abdomen and in the groin. °· Contractions may go away when you walk around or change positions while lying down. °· Contractions get weaker and are shorter-lasting as time goes on. °· The cervix usually does not dilate or become thin. °Follow these instructions at home: °· Take over-the-counter and prescription medicines only as told by your health care provider. °· Keep up with your usual exercises and follow other instructions from your health care provider. °· Eat and drink lightly if you think you are going into labor. °· If Braxton Hicks contractions are making you uncomfortable: °? Change your position from lying down or resting to walking, or change from walking to resting. °? Sit and rest in a tub of warm water. °? Drink enough fluid to keep your urine pale yellow. Dehydration may cause these contractions. °? Do slow and deep breathing several times an hour. °· Keep all follow-up prenatal visits as told by your health care provider. This is important. °Contact a health care provider if: °· You have a fever. °· You have continuous pain in your abdomen. °Get help right away if: °· Your contractions become stronger, more regular, and closer together. °· You have fluid leaking or gushing from your vagina. °· You pass blood-tinged mucus (bloody show). °· You have bleeding from your vagina. °· You have low back pain that you never had before. °· You feel your baby’s head pushing down and causing pelvic pressure. °· Your baby is not moving inside you as much as it used to. °Summary °· Contractions that occur before labor are called Braxton   Hicks contractions, false labor, or practice contractions. °· Braxton Hicks contractions are usually shorter, weaker, farther apart, and less regular than true labor contractions. True labor contractions usually become progressively stronger and regular and they become more frequent. °· Manage discomfort from Braxton Hicks contractions by  changing position, resting in a warm bath, drinking plenty of water, or practicing deep breathing. °This information is not intended to replace advice given to you by your health care provider. Make sure you discuss any questions you have with your health care provider. °Document Released: 06/19/2016 Document Revised: 06/19/2016 Document Reviewed: 06/19/2016 °Elsevier Interactive Patient Education © 2018 [employer] ° °

## 2017-06-13 NOTE — MAU Provider Note (Signed)
History   G1 @ 38.3 wks in with contractions since 11:00 today states has mucous discharge. Denies vag bleeding, or ROM  CSN: [id number]  Arrival date & time 06/13/17  1236   None     Chief Complaint  Patient presents with  . Labor Eval    HPI  Past Medical History:  Diagnosis Date  . Medical history non-contributory     Past Surgical History:  Procedure Laterality Date  . WISDOM TOOTH EXTRACTION      History reviewed. No pertinent family history.  Social History   Tobacco Use  . Smoking status: Former Games developer  . Smokeless tobacco: Never Used  Substance Use Topics  . Alcohol use: Yes    Comment: none with preg  . Drug use: No    OB History    Gravida  1   Para      Term      Preterm      AB      Living  0     SAB      TAB      Ectopic      Multiple      Live Births              Review of Systems  Constitutional: Negative.   HENT: Negative.   Eyes: Negative.   Respiratory: Negative.   Cardiovascular: Negative.   Gastrointestinal: Positive for abdominal pain.  Endocrine: Negative.   Genitourinary: Negative.   Musculoskeletal: Negative.   Skin: Negative.   Allergic/Immunologic: Negative.   Neurological: Negative.   Hematological: Negative.   Psychiatric/Behavioral: Negative.     Allergies  Patient has no known allergies.  Home Medications    BP 128/79   Pulse 88   Temp 98.2 F (36.8 C) (Oral)   Resp 17   Wt 198 lb 6 oz (90 kg)   LMP 09/10/2016 (Exact Date) Comment: Unsure of LMP  SpO2 97%   BMI 36.28 kg/m   Physical Exam  Constitutional: She is oriented to person, place, and time. She appears well-developed and well-nourished.  HENT:  Head: Normocephalic.  Neck: Normal range of motion.  Cardiovascular: Normal rate, regular rhythm, normal heart sounds and intact distal pulses.  Pulmonary/Chest: Effort normal and breath sounds normal.  Abdominal: Soft. Bowel sounds are normal.  Genitourinary: Vagina normal and  uterus normal.  Musculoskeletal: Normal range of motion.  Neurological: She is alert and oriented to person, place, and time.  Skin: Skin is warm and dry.    MAU Course  Procedures (including critical care time)  Labs Reviewed - No data to display No results found.   1. False labor at or after 37 completed weeks of gestation       MDM  FHR pattern reassuring, 15x15 accels noted no decels. Mild occasional uc. SVE 1/70/ballots. Will d/c home not in labor.

## 2017-06-15 ENCOUNTER — Encounter (HOSPITAL_COMMUNITY): Payer: Self-pay | Admitting: *Deleted

## 2017-06-15 ENCOUNTER — Inpatient Hospital Stay (HOSPITAL_COMMUNITY): Payer: BLUE CROSS/BLUE SHIELD

## 2017-06-15 ENCOUNTER — Other Ambulatory Visit: Payer: Self-pay

## 2017-06-15 ENCOUNTER — Inpatient Hospital Stay (HOSPITAL_COMMUNITY)
Admission: AD | Admit: 2017-06-15 | Discharge: 2017-06-15 | Disposition: A | Discharge status: Home / Self Care | Payer: BLUE CROSS/BLUE SHIELD | Source: Ambulatory Visit | Attending: Obstetrics & Gynecology | Admitting: Obstetrics & Gynecology

## 2017-06-15 DIAGNOSIS — N898 Other specified noninflammatory disorders of vagina: Secondary | ICD-10-CM | POA: Insufficient documentation

## 2017-06-15 DIAGNOSIS — Z3A38 38 weeks gestation of pregnancy: Secondary | ICD-10-CM | POA: Insufficient documentation

## 2017-06-15 DIAGNOSIS — O26893 Other specified pregnancy related conditions, third trimester: Secondary | ICD-10-CM | POA: Diagnosis not present

## 2017-06-15 DIAGNOSIS — O479 False labor, unspecified: Secondary | ICD-10-CM

## 2017-06-15 DIAGNOSIS — Z87891 Personal history of nicotine dependence: Secondary | ICD-10-CM | POA: Diagnosis not present

## 2017-06-15 DIAGNOSIS — R109 Unspecified abdominal pain: Secondary | ICD-10-CM | POA: Diagnosis present

## 2017-06-15 DIAGNOSIS — O368390 Maternal care for abnormalities of the fetal heart rate or rhythm, unspecified trimester, not applicable or unspecified: Secondary | ICD-10-CM

## 2017-06-15 LAB — POCT FERN TEST: POCT FERN TEST: NEGATIVE

## 2017-06-15 NOTE — MAU Note (Signed)
In pain and has been leaking  (first noted about 0500), underwear has not been dry, put on a pad and it is not dry either.  Pain is in back and bottom of stomach. Comes and goes.

## 2017-06-15 NOTE — MAU Provider Note (Addendum)
Chief Complaint:  Abdominal Pain; Back Pain; and Rupture of Membranes   First Provider Initiated Contact with Patient 06/15/17 1407     HPI: Yolanda Martinez is a 22 y.o. G1P0 at 3w5dwho presents to maternity admissions reporting contractions and possible leaking. While getting evaluated for labor, FHR decelerated to 100 x 6 min.  I went in and patient was being turned, FHR went back up and is now reactive. . She reports good fetal movement, denies vaginal bleeding, vaginal itching/burning, urinary symptoms, h/a, dizziness, n/v, diarrhea, constipation or fever/chills.  She denies headache, visual changes or RUQ abdominal pain.  Abdominal Pain  This is a new problem. The current episode started today. The onset quality is gradual. The problem occurs intermittently. The problem has been unchanged. The pain is located in the LLQ, RLQ and epigastric region. The pain is mild. The quality of the pain is cramping and sharp. The abdominal pain does not radiate. Pertinent negatives include no constipation, diarrhea, dysuria, fever, frequency, nausea or vomiting. Nothing aggravates the pain. The pain is relieved by nothing. She has tried nothing for the symptoms.  Back Pain  Associated symptoms include abdominal pain. Pertinent negatives include no dysuria or fever.     Past Medical History: Past Medical History:  Diagnosis Date  . Medical history non-contributory     Past obstetric history: OB History  Gravida Para Term Preterm AB Living  1         0  SAB TAB Ectopic Multiple Live Births               # Outcome Date GA Lbr Len/2nd Weight Sex Delivery Anes PTL Lv  1 Current             Past Surgical History: Past Surgical History:  Procedure Laterality Date  . NO PAST SURGERIES    . WISDOM TOOTH EXTRACTION      Family History: Family History  Problem Relation Age of Onset  . Diabetes Mother   . Diabetes Father     Social History: Social History   Tobacco Use  . Smoking status:  Former Smoker    Types: Cigars  . Smokeless tobacco: Never Used  . Tobacco comment: quit prior to preg  Substance Use Topics  . Alcohol use: Yes    Comment: none with preg  . Drug use: Not Currently    Types: Marijuana    Comment: Aug 2018    Allergies: No Known Allergies  Meds:  Medications Prior to Admission  Medication Sig Dispense Refill Last Dose  . Prenatal Vit-Fe Fumarate-FA (PRENATAL MULTIVITAMIN) TABS tablet Take 1 tablet by mouth daily at 12 noon.   06/11/2017 at Unknown time    I have reviewed patient's Past Medical Hx, Surgical Hx, Family Hx, Social Hx, medications and allergies.   ROS:  Review of Systems  Constitutional: Negative for chills and fever.  Gastrointestinal: Positive for abdominal pain. Negative for constipation, diarrhea, nausea and vomiting.  Genitourinary: Positive for vaginal discharge. Negative for dysuria, frequency and vaginal bleeding.  Musculoskeletal: Positive for back pain.  Neurological: Negative for dizziness.   Other systems negative  Physical Exam   Patient Vitals for the past 24 hrs:  BP Temp Temp src Pulse Resp SpO2 Weight  06/15/17 1245 129/80 98.2 F (36.8 C) Oral 89 16 100 % 202 lb 12 oz (92 kg)   Constitutional: Well-developed, well-nourished female in no acute distress.  Cardiovascular: normal rate and rhythm Respiratory: normal effort, clear to auscultation bilaterally GI:  Abd soft, non-tender, gravid appropriate for gestational age.   No rebound or guarding. MS: Extremities nontender, no edema, normal ROM Neurologic: Alert and oriented x 4.  GU: Neg CVAT.  PELVIC EXAM: Cervix pink, visually closed, without lesion, scant mucous discharge, vaginal walls and external genitalia normal              NO POOLING Bimanual exam: Cervix firm, posterior, neg CMT, uterus nontender, Fundal Height consistent with dates, adnexa without tenderness, enlargement, or mass  Dilation: 1.5 Effacement (%): 70, 80 Station: -2 Exam by:: Yolanda Martinez, CNM  FHT:  Baseline 135 , moderate variability, accelerations present, no decelerations Contractions:  Irregular     Labs: Results for orders placed or performed during the hospital encounter of 06/15/17 (from the past 24 hour(s))  Fern Test     Status: None   Collection Time: 06/15/17  1:00 PM  Result Value Ref Range   POCT Fern Test Negative = intact amniotic membranes    --/--/A POS (08/30 1726)  Imaging:  No results found.  MAU Course/MDM: I have ordered labs and reviewed results.  NST reviewed, reactive except for one prolonged decel  Treatments in MAU included NST, BPP, Sterile speculum exam.    BPP 8/8, AFI 12.6cm, cephalic presentation  Assessment: 1. Variable fetal heart rate decelerations, antepartum   2.      Irregular mild uterine contractions 3.      Vaginal discharge, no evidence of ruptured membranes  Plan: Discharge home Labor precautions and fetal kick counts Follow up in Office for prenatal visits and recheck on Wed IOL planned for 06/28/17 Encouraged to return here or to other Urgent Care/ED if she develops worsening of symptoms, increase in pain, fever, or other concerning symptoms.   Pt stable at time of discharge.  Yolanda Martinez CNM, MSN Certified Nurse-Midwife 06/15/2017 2:08 PM   FHR reviewed Pt has one deceleration not assoc with a contraction. She had a Cat I FHR otherwise.  She had accels with contractions.   Attestation of Attending Supervision of Advanced Practitioner (CNM/NP): Evaluation and management procedures were performed by the Advanced Practitioner under my supervision and collaboration.  I have reviewed the Advanced Practitioner's note and chart, and I agree with the management and plan.  Yolanda Martinez 4:24 PM

## 2017-06-15 NOTE — Discharge Instructions (Signed)
Vaginal Delivery Vaginal delivery means that you will give birth by pushing your baby out of your birth canal (vagina). A team of health care providers will help you before, during, and after vaginal delivery. Birth experiences are unique for every woman and every pregnancy, and birth experiences vary depending on where you choose to give birth. What should I do to prepare for my baby's birth? Before your baby is born, it is important to talk with your health care provider about:  Your labor and delivery preferences. These may include: ? Medicines that you may be given. ? How you will manage your pain. This might include non-medical pain relief techniques or injectable pain relief such as epidural analgesia. ? How you and your baby will be monitored during labor and delivery. ? Who may be in the labor and delivery room with you. ? Your feelings about surgical delivery of your baby (cesarean delivery, or C-section) if this becomes necessary. ? Your feelings about receiving donated blood through an IV tube (blood transfusion) if this becomes necessary.  Whether you are able: ? To take pictures or videos of the birth. ? To eat during labor and delivery. ? To move around, walk, or change positions during labor and delivery.  What to expect after your baby is born, such as: ? Whether delayed umbilical cord clamping and cutting is offered. ? Who will care for your baby right after birth. ? Medicines or tests that may be recommended for your baby. ? Whether breastfeeding is supported in your hospital or birth center. ? How long you will be in the hospital or birth center.  How any medical conditions you have may affect your baby or your labor and delivery experience.  To prepare for your baby's birth, you should also:  Attend all of your health care visits before delivery (prenatal visits) as recommended by your health care provider. This is important.  Prepare your home for your baby's  arrival. Make sure that you have: ? Diapers. ? Baby clothing. ? Feeding equipment. ? Safe sleeping arrangements for you and your baby.  Install a car seat in your vehicle. Have your car seat checked by a certified car seat installer to make sure that it is installed safely.  Think about who will help you with your new baby at home for at least the first several weeks after delivery.  What can I expect when I arrive at the birth center or hospital? Once you are in labor and have been admitted into the hospital or birth center, your health care provider may:  Review your pregnancy history and any concerns you have.  Insert an IV tube into one of your veins. This is used to give you fluids and medicines.  Check your blood pressure, pulse, temperature, and heart rate (vital signs).  Check whether your bag of water (amniotic sac) has broken (ruptured).  Talk with you about your birth plan and discuss pain control options.  Monitoring Your health care provider may monitor your contractions (uterine monitoring) and your baby's heart rate (fetal monitoring). You may need to be monitored:  Often, but not continuously (intermittently).  All the time or for long periods at a time (continuously). Continuous monitoring may be needed if: ? You are taking certain medicines, such as medicine to relieve pain or make your contractions stronger. ? You have pregnancy or labor complications.  Monitoring may be done by:  Placing a special stethoscope or a handheld monitoring device on your abdomen to   check your baby's heartbeat, and feeling your abdomen for contractions. This method of monitoring does not continuously record your baby's heartbeat or your contractions.  Placing monitors on your abdomen (external monitors) to record your baby's heartbeat and the frequency and length of contractions. You may not have to wear external monitors all the time.  Placing monitors inside of your uterus  (internal monitors) to record your baby's heartbeat and the frequency, length, and strength of your contractions. ? Your health care provider may use internal monitors if he or she needs more information about the strength of your contractions or your baby's heart rate. ? Internal monitors are put in place by passing a thin, flexible wire through your vagina and into your uterus. Depending on the type of monitor, it may remain in your uterus or on your baby's head until birth. ? Your health care provider will discuss the benefits and risks of internal monitoring with you and will ask for your permission before inserting the monitors.  Telemetry. This is a type of continuous monitoring that can be done with external or internal monitors. Instead of having to stay in bed, you are able to move around during telemetry. Ask your health care provider if telemetry is an option for you.  Physical exam Your health care provider may perform a physical exam. This may include:  Checking whether your baby is positioned: ? With the head toward your vagina (head-down). This is most common. ? With the head toward the top of your uterus (head-up or breech). If your baby is in a breech position, your health care provider may try to turn your baby to a head-down position so you can deliver vaginally. If it does not seem that your baby can be born vaginally, your provider may recommend surgery to deliver your baby. In rare cases, you may be able to deliver vaginally if your baby is head-up (breech delivery). ? Lying sideways (transverse). Babies that are lying sideways cannot be delivered vaginally.  Checking your cervix to determine: ? Whether it is thinning out (effacing). ? Whether it is opening up (dilating). ? How low your baby has moved into your birth canal.  What are the three stages of labor and delivery?  Normal labor and delivery is divided into the following three stages: Stage 1  Stage 1 is the  longest stage of labor, and it can last for hours or days. Stage 1 includes: ? Early labor. This is when contractions may be irregular, or regular and mild. Generally, early labor contractions are more than 10 minutes apart. ? Active labor. This is when contractions get longer, more regular, more frequent, and more intense. ? The transition phase. This is when contractions happen very close together, are very intense, and may last longer than during any other part of labor.  Contractions generally feel mild, infrequent, and irregular at first. They get stronger, more frequent (about every 2-3 minutes), and more regular as you progress from early labor through active labor and transition.  Many women progress through stage 1 naturally, but you may need help to continue making progress. If this happens, your health care provider may talk with you about: ? Rupturing your amniotic sac if it has not ruptured yet. ? Giving you medicine to help make your contractions stronger and more frequent.  Stage 1 ends when your cervix is completely dilated to 4 inches (10 cm) and completely effaced. This happens at the end of the transition phase. Stage 2  Once   your cervix is completely effaced and dilated to 4 inches (10 cm), you may start to feel an urge to push. It is common for the body to naturally take a rest before feeling the urge to push, especially if you received an epidural or certain other pain medicines. This rest period may last for up to 1-2 hours, depending on your unique labor experience.  During stage 2, contractions are generally less painful, because pushing helps relieve contraction pain. Instead of contraction pain, you may feel stretching and burning pain, especially when the widest part of your baby's head passes through the vaginal opening (crowning).  Your health care provider will closely monitor your pushing progress and your baby's progress through the vagina during stage 2.  Your  health care provider may massage the area of skin between your vaginal opening and anus (perineum) or apply warm compresses to your perineum. This helps it stretch as the baby's head starts to crown, which can help prevent perineal tearing. ? In some cases, an incision may be made in your perineum (episiotomy) to allow the baby to pass through the vaginal opening. An episiotomy helps to make the opening of the vagina larger to allow more room for the baby to fit through.  It is very important to breathe and focus so your health care provider can control the delivery of your baby's head. Your health care provider may have you decrease the intensity of your pushing, to help prevent perineal tearing.  After delivery of your baby's head, the shoulders and the rest of the body generally deliver very quickly and without difficulty.  Once your baby is delivered, the umbilical cord may be cut right away, or this may be delayed for 1-2 minutes, depending on your baby's health. This may vary among health care providers, hospitals, and birth centers.  If you and your baby are healthy enough, your baby may be placed on your chest or abdomen to help maintain the baby's temperature and to help you bond with each other. Some mothers and babies start breastfeeding at this time. Your health care team will dry your baby and help keep your baby warm during this time.  Your baby may need immediate care if he or she: ? Showed signs of distress during labor. ? Has a medical condition. ? Was born too early (prematurely). ? Had a bowel movement before birth (meconium). ? Shows signs of difficulty transitioning from being inside the uterus to being outside of the uterus. If you are planning to breastfeed, your health care team will help you begin a feeding. Stage 3  The third stage of labor starts immediately after the birth of your baby and ends after you deliver the placenta. The placenta is an organ that develops  during pregnancy to provide oxygen and nutrients to your baby in the womb.  Delivering the placenta may require some pushing, and you may have mild contractions. Breastfeeding can stimulate contractions to help you deliver the placenta.  After the placenta is delivered, your uterus should tighten (contract) and become firm. This helps to stop bleeding in your uterus. To help your uterus contract and to control bleeding, your health care provider may: ? Give you medicine by injection, through an IV tube, by mouth, or through your rectum (rectally). ? Massage your abdomen or perform a vaginal exam to remove any blood clots that are left in your uterus. ? Empty your bladder by placing a thin, flexible tube (catheter) into your bladder. ? Encourage   you to breastfeed your baby. After labor is over, you and your baby will be monitored closely to ensure that you are both healthy until you are ready to go home. Your health care team will teach you how to care for yourself and your baby. This information is not intended to replace advice given to you by your health care provider. Make sure you discuss any questions you have with your health care provider. Document Released: 11/13/2007 Document Revised: 08/24/2015 Document Reviewed: 02/18/2015 Elsevier Interactive Patient Education  2018 [employer]  

## 2017-06-17 ENCOUNTER — Inpatient Hospital Stay (HOSPITAL_COMMUNITY)
Admission: AD | Admit: 2017-06-17 | Discharge: 2017-06-17 | Disposition: A | Discharge status: Home / Self Care | Payer: BLUE CROSS/BLUE SHIELD | Source: Ambulatory Visit | Attending: Obstetrics & Gynecology | Admitting: Obstetrics & Gynecology

## 2017-06-17 ENCOUNTER — Encounter (HOSPITAL_COMMUNITY): Payer: Self-pay

## 2017-06-17 DIAGNOSIS — O479 False labor, unspecified: Secondary | ICD-10-CM

## 2017-06-17 NOTE — MAU Note (Signed)
I have communicated with Leftwich-Kirby CNM and reviewed vital signs:  Vitals:   06/17/17 0759 06/17/17 1024  BP: 129/83 122/77  Pulse: 81   Resp: 15   Temp: 98.5 F (36.9 C)   SpO2: 100%     Vaginal exam:  Dilation: 1.5 Effacement (%): 50 Cervical Position: Anterior Station: -3 Presentation: Vertex Exam by:: n Linwood Gullikson rn,   Also reviewed contraction pattern and that non-stress test is reactive.  It has been documented that patient is contracting occasionally with no cervical change over 1 hours not indicating active labor.  Patient denies any other complaints.  Based on this report provider has given order for discharge.  A discharge order and diagnosis entered by a provider.   Labor discharge instructions reviewed with patient.

## 2017-06-17 NOTE — MAU Note (Signed)
Pt reports contractions and cramping, back pain.

## 2017-06-17 NOTE — Discharge Instructions (Signed)

## 2017-06-18 ENCOUNTER — Encounter (HOSPITAL_COMMUNITY): Payer: Self-pay | Admitting: *Deleted

## 2017-06-18 ENCOUNTER — Inpatient Hospital Stay (HOSPITAL_COMMUNITY)
Admission: AD | Admit: 2017-06-18 | Discharge: 2017-06-21 | DRG: 788 | Disposition: A | Discharge status: Home / Self Care | Payer: BLUE CROSS/BLUE SHIELD | Source: Ambulatory Visit | Attending: Obstetrics and Gynecology | Admitting: Obstetrics and Gynecology

## 2017-06-18 ENCOUNTER — Telehealth (HOSPITAL_COMMUNITY): Payer: Self-pay | Admitting: *Deleted

## 2017-06-18 DIAGNOSIS — Z87891 Personal history of nicotine dependence: Secondary | ICD-10-CM | POA: Diagnosis not present

## 2017-06-18 DIAGNOSIS — Z98891 History of uterine scar from previous surgery: Secondary | ICD-10-CM

## 2017-06-18 DIAGNOSIS — Z3A40 40 weeks gestation of pregnancy: Secondary | ICD-10-CM

## 2017-06-18 DIAGNOSIS — O99824 Streptococcus B carrier state complicating childbirth: Secondary | ICD-10-CM | POA: Diagnosis present

## 2017-06-18 DIAGNOSIS — O4292 Full-term premature rupture of membranes, unspecified as to length of time between rupture and onset of labor: Principal | ICD-10-CM | POA: Diagnosis present

## 2017-06-18 LAB — COMPREHENSIVE METABOLIC PANEL WITH GFR
ALT: 27 U/L (ref 14–54)
AST: 28 U/L (ref 15–41)
Albumin: 2.6 g/dL — ABNORMAL LOW (ref 3.5–5.0)
Alkaline Phosphatase: 132 U/L — ABNORMAL HIGH (ref 38–126)
Anion gap: 6 (ref 5–15)
BUN: 8 mg/dL (ref 6–20)
CO2: 20 mmol/L — ABNORMAL LOW (ref 22–32)
Calcium: 8.7 mg/dL — ABNORMAL LOW (ref 8.9–10.3)
Chloride: 110 mmol/L (ref 101–111)
Creatinine, Ser: 0.7 mg/dL (ref 0.44–1.00)
GFR calc Af Amer: >60 mL/min (ref >60)
GFR calc non Af Amer: >60 mL/min (ref >60)
Glucose, Bld: 81 mg/dL (ref 65–99)
Potassium: 4 mmol/L (ref 3.5–5.1)
Sodium: 136 mmol/L (ref 135–145)
Total Bilirubin: 0.3 mg/dL (ref 0.3–1.2)
Total Protein: 6.9 g/dL (ref 6.5–8.1)

## 2017-06-18 LAB — TYPE AND SCREEN
ABO/RH(D): A POS
ANTIBODY SCREEN: NEGATIVE

## 2017-06-18 LAB — CBC
HCT: 35.2 % — ABNORMAL LOW (ref 36.0–46.0)
Hemoglobin: 11.9 g/dL — ABNORMAL LOW (ref 12.0–15.0)
MCH: 29.9 pg (ref 26.0–34.0)
MCHC: 33.8 g/dL (ref 30.0–36.0)
MCV: 88.4 fL (ref 78.0–100.0)
Platelets: 152 K/uL (ref 150–400)
RBC: 3.98 MIL/uL (ref 3.87–5.11)
RDW: 13.7 % (ref 11.5–15.5)
WBC: 7.3 K/uL (ref 4.0–10.5)

## 2017-06-18 LAB — POCT FERN TEST: POCT Fern Test: POSITIVE

## 2017-06-18 LAB — ABO/RH: ABO/RH(D): A POS

## 2017-06-18 MED ORDER — OXYTOCIN BOLUS FROM INFUSION: 500.0000 mL | Freq: Once | INTRAVENOUS | Status: DC

## 2017-06-18 MED ORDER — OXYTOCIN 40 UNITS IN LACTATED RINGERS INFUSION - SIMPLE MED: 2.5000 [IU]/h | INTRAVENOUS | Status: DC

## 2017-06-18 MED ORDER — SODIUM CHLORIDE 0.9 % IV SOLN
5.0000 10*6.[IU] | Freq: Once | INTRAVENOUS | Status: AC
  Administered 2017-06-18: 5 10*6.[IU] via INTRAVENOUS
  Filled 2017-06-18: qty 5

## 2017-06-18 MED ORDER — LACTATED RINGERS IV SOLN
INTRAVENOUS | Status: DC
  Administered 2017-06-18 – 2017-06-19 (×4): via INTRAVENOUS

## 2017-06-18 MED ORDER — OXYCODONE-ACETAMINOPHEN 5-325 MG PO TABS: 1.0000 | ORAL_TABLET | ORAL | Status: DC | PRN

## 2017-06-18 MED ORDER — OXYTOCIN 40 UNITS IN LACTATED RINGERS INFUSION - SIMPLE MED
1.0000 m[IU]/min | INTRAVENOUS | Status: DC
  Administered 2017-06-18: 2 m[IU]/min via INTRAVENOUS
  Filled 2017-06-18: qty 1000

## 2017-06-18 MED ORDER — TERBUTALINE SULFATE 1 MG/ML IJ SOLN: 0.2500 mg | Freq: Once | INTRAMUSCULAR | Status: DC | PRN

## 2017-06-18 MED ORDER — ACETAMINOPHEN 325 MG PO TABS: 650.0000 mg | ORAL_TABLET | ORAL | Status: DC | PRN

## 2017-06-18 MED ORDER — LACTATED RINGERS IV SOLN
500.0000 mL | INTRAVENOUS | Status: DC | PRN
  Administered 2017-06-18: 500 mL via INTRAVENOUS

## 2017-06-18 MED ORDER — FLEET ENEMA 7-19 GM/118ML RE ENEM: 1.0000 | ENEMA | RECTAL | Status: DC | PRN

## 2017-06-18 MED ORDER — ONDANSETRON HCL 4 MG/2ML IJ SOLN: 4.0000 mg | Freq: Four times a day (QID) | INTRAMUSCULAR | Status: DC | PRN

## 2017-06-18 MED ORDER — OXYCODONE-ACETAMINOPHEN 5-325 MG PO TABS: 2.0000 | ORAL_TABLET | ORAL | Status: DC | PRN

## 2017-06-18 MED ORDER — PENICILLIN G POT IN DEXTROSE 60000 UNIT/ML IV SOLN
3.0000 10*6.[IU] | INTRAVENOUS | Status: DC
  Administered 2017-06-18 – 2017-06-19 (×4): 3 10*6.[IU] via INTRAVENOUS
  Filled 2017-06-18 (×5): qty 50

## 2017-06-18 MED ORDER — LIDOCAINE HCL (PF) 1 % IJ SOLN: 30.0000 mL | INTRAMUSCULAR | Status: DC | PRN

## 2017-06-18 MED ORDER — SOD CITRATE-CITRIC ACID 500-334 MG/5ML PO SOLN
30.0000 mL | ORAL | Status: DC | PRN
  Administered 2017-06-19: 30 mL via ORAL
  Filled 2017-06-18: qty 15

## 2017-06-18 NOTE — Progress Notes (Signed)
Vitals:   06/18/17 1919 06/18/17 2009  BP: 107/82 126/72  Pulse: 83 76  Resp: 16 18  Temp:    SpO2:     Comfortable, starting to feel some contractions. FHR Cat 1.  Wants to eat, regular diet ordered.  Plan Pitocin when foley falls out if not in labor

## 2017-06-18 NOTE — MAU Note (Signed)
Pt reports ROM at 1400 and contractions.

## 2017-06-18 NOTE — H&P (Signed)
OBSTETRIC ADMISSION HISTORY AND PHYSICAL  Yolanda Martinez is a 22 y.o. female G1P0 with IUP at [redacted]w[redacted]d by LMP presenting for SROM at 1400. She reports +FMs, No LOF, no VB, no blurry vision, headaches or peripheral edema, and RUQ pain.  She plans on breast and bottle feeding. She request nexplanon for birth control. She received her prenatal care at [facility]   Dating: By LMP --->  Estimated Date of Delivery: 06/17/17  Sono:    , CWD, normal anatomy, cehpalic presentation   Prenatal History/Complications:  Past Medical History: Past Medical History:  Diagnosis Date  . Medical history non-contributory   . Vaginal Pap smear, abnormal     Past Surgical History: Past Surgical History:  Procedure Laterality Date  . NO PAST SURGERIES    . WISDOM TOOTH EXTRACTION      Obstetrical History: OB History    Gravida  1   Para      Term      Preterm      AB      Living  0     SAB      TAB      Ectopic      Multiple      Live Births              Social History: Social History   Socioeconomic History  . Marital status: Single    Spouse name: Not on file  . Number of children: Not on file  . Years of education: Not on file  . Highest education level: Not on file  Occupational History  . Not on file  Social Needs  . Financial resource strain: Not on file  . Food insecurity:    Worry: Not on file    Inability: Not on file  . Transportation needs:    Medical: Not on file    Non-medical: Not on file  Tobacco Use  . Smoking status: Former Smoker    Types: Cigars  . Smokeless tobacco: Never Used  . Tobacco comment: quit prior to preg  Substance and Sexual Activity  . Alcohol use: Yes    Comment: none with preg  . Drug use: Not Currently    Types: Marijuana    Comment: Aug 2018  . Sexual activity: Yes    Birth control/protection: None  Lifestyle  . Physical activity:    Days per week: Not on file    Minutes per session: Not on file  . Stress: Not on  file  Relationships  . Social connections:    Talks on phone: Not on file    Gets together: Not on file    Attends religious service: Not on file    Active member of club or organization: Not on file    Attends meetings of clubs or organizations: Not on file    Relationship status: Not on file  Other Topics Concern  . Not on file  Social History Narrative  . Not on file    Family History: Family History  Problem Relation Age of Onset  . Diabetes Mother   . Diabetes Father     Allergies: No Known Allergies  Medications Prior to Admission  Medication Sig Dispense Refill Last Dose  . Prenatal Vit-Fe Fumarate-FA (PRENATAL MULTIVITAMIN) TABS tablet Take 1 tablet by mouth daily at 12 noon.   06/16/2017 at Unknown time     Review of Systems   All systems reviewed and negative except as stated in HPI  Blood pressure Marland Kitchen)  138/93, pulse 98, temperature 98.8 F (37.1 C), temperature source Oral, resp. rate 16, height  (1.575 m), weight 90.7 kg (200 lb), last menstrual period 09/10/2016, SpO2 100 %. General appearance: alert and cooperative Lungs: clear to auscultation bilaterally Heart: regular rate and rhythm Abdomen: soft, non-tender; bowel sounds normal Extremities: Homans sign is negative, no sign of DVT DTR's normal Presentation: cephalic Fetal monitoringBaseline: 130 bpm, Variability: Good {> 6 bpm), Accelerations: Non-reactive but appropriate for gestational age and Decelerations: Absent Uterine activityFrequency: Every 5-6 minutes     Prenatal labs: ABO, Rh: A/Positive/-- (10/01 0000) Antibody: Negative (10/01 0000) Rubella: Immune (10/01 0000) RPR: Nonreactive (10/01 0000)  HBsAg: Negative (10/01 0000)  HIV: Non-reactive (10/01 0000)  GBS: Positive (03/28 0000)  Glucose: 1 hour/3hour->133/NA Genetic screening  normal Anatomy US normal  Prenatal Transfer Tool  Maternal Diabetes: No Genetic Screening: Normal Maternal Ultrasounds/Referrals: Normal Fetal  Ultrasounds or other Referrals:  None Maternal Substance Abuse:  MJ and nicotine Significant Maternal Medications:  None Significant Maternal Lab Results: Lab values include: Other: GBS +, ASCUS w/ High risk HPV  Results for orders placed or performed during the hospital encounter of 06/18/17 (from the past 24 hour(s))  [facility] Time: 06/18/17  2:55 PM  Result Value Ref Range   POCT Fern Test Positive = ruptured amniotic membanes     Patient Active Problem List   Diagnosis Date Noted  . Labor and delivery, indication for care 06/18/2017    Assessment/Plan:  Yolanda Martinez is a 22 y.o. G1P0 at 100w1d here for SROM. Patient had SROm @ 1400. S/P FB. When F/B out will likely start pitocin.  #Labor:F/B for now, pitocin when FB out #Pain: Analgesia prn, thinking about epidural #FWB: Cat 1 #ID:  PCN ppx #MOF: breast and bottle #MOC: nexplanon #Circ:  N/A  Myrene Buddy, MD  06/18/2017, 3:34 PM

## 2017-06-18 NOTE — Telephone Encounter (Signed)
Preadmission screen  

## 2017-06-18 NOTE — Progress Notes (Signed)
Vitals:   06/18/17 2009 06/18/17 2140  BP: 126/72 120/65  Pulse: 76 78  Resp: 18 16  Temp:  98.4 F (36.9 C)  SpO2:     Foley fell out, pt ate and is now on 3 mu/min of Pitocin. Says ctx are getting more frequent and a litlle stronger.  FHR Cat 1.

## 2017-06-19 ENCOUNTER — Encounter (HOSPITAL_COMMUNITY): Payer: Self-pay | Admitting: Certified Registered Nurse Anesthetist

## 2017-06-19 ENCOUNTER — Encounter (HOSPITAL_COMMUNITY): Payer: Self-pay | Admitting: Anesthesiology

## 2017-06-19 ENCOUNTER — Inpatient Hospital Stay (HOSPITAL_COMMUNITY): Payer: BLUE CROSS/BLUE SHIELD | Admitting: Anesthesiology

## 2017-06-19 ENCOUNTER — Encounter (HOSPITAL_COMMUNITY)
Admission: AD | Disposition: A | Discharge status: Home / Self Care | Payer: Self-pay | Source: Ambulatory Visit | Attending: Obstetrics and Gynecology

## 2017-06-19 DIAGNOSIS — Z3A40 40 weeks gestation of pregnancy: Secondary | ICD-10-CM

## 2017-06-19 DIAGNOSIS — O99824 Streptococcus B carrier state complicating childbirth: Secondary | ICD-10-CM

## 2017-06-19 LAB — RPR: RPR: NONREACTIVE

## 2017-06-19 SURGERY — Surgical Case
Anesthesia: Epidural | Wound class: Clean Contaminated

## 2017-06-19 MED ORDER — NALBUPHINE HCL 10 MG/ML IJ SOLN: 5.0000 mg | INTRAMUSCULAR | Status: DC | PRN

## 2017-06-19 MED ORDER — IBUPROFEN 600 MG PO TABS
600.0000 mg | ORAL_TABLET | Freq: Four times a day (QID) | ORAL | Status: DC
  Administered 2017-06-19 – 2017-06-21 (×8): 600 mg via ORAL
  Filled 2017-06-19 (×8): qty 1

## 2017-06-19 MED ORDER — LACTATED RINGERS IV SOLN
INTRAVENOUS | Status: DC
  Administered 2017-06-19: 300 mL via INTRAUTERINE
  Administered 2017-06-19: 07:00:00 via INTRAUTERINE

## 2017-06-19 MED ORDER — FENTANYL CITRATE (PF) 100 MCG/2ML IJ SOLN: 25.0000 ug | INTRAMUSCULAR | Status: DC | PRN

## 2017-06-19 MED ORDER — TETANUS-DIPHTH-ACELL PERTUSSIS 5-2.5-18.5 LF-MCG/0.5 IM SUSP: 0.5000 mL | Freq: Once | INTRAMUSCULAR | Status: DC

## 2017-06-19 MED ORDER — NALBUPHINE HCL 10 MG/ML IJ SOLN
5.0000 mg | Freq: Once | INTRAMUSCULAR | Status: AC | PRN
  Administered 2017-06-19: 5 mg via SUBCUTANEOUS

## 2017-06-19 MED ORDER — ONDANSETRON HCL 4 MG/2ML IJ SOLN
INTRAMUSCULAR | Status: AC
  Filled 2017-06-19: qty 2

## 2017-06-19 MED ORDER — NALOXONE HCL 4 MG/10ML IJ SOLN
1.0000 ug/kg/h | INTRAVENOUS | Status: DC | PRN
  Filled 2017-06-19: qty 5

## 2017-06-19 MED ORDER — SODIUM CHLORIDE 0.9% FLUSH: 3.0000 mL | INTRAVENOUS | Status: DC | PRN

## 2017-06-19 MED ORDER — SIMETHICONE 80 MG PO CHEW: 80.0000 mg | CHEWABLE_TABLET | ORAL | Status: DC | PRN

## 2017-06-19 MED ORDER — KETOROLAC TROMETHAMINE 30 MG/ML IJ SOLN: 30.0000 mg | Freq: Four times a day (QID) | INTRAMUSCULAR | Status: AC | PRN

## 2017-06-19 MED ORDER — MEPERIDINE HCL 25 MG/ML IJ SOLN: 6.2500 mg | INTRAMUSCULAR | Status: DC | PRN

## 2017-06-19 MED ORDER — ZOLPIDEM TARTRATE 5 MG PO TABS: 5.0000 mg | ORAL_TABLET | Freq: Every evening | ORAL | Status: DC | PRN

## 2017-06-19 MED ORDER — LACTATED RINGERS IV SOLN
INTRAVENOUS | Status: DC
  Administered 2017-06-19: 18:00:00 via INTRAVENOUS

## 2017-06-19 MED ORDER — NALOXONE HCL 0.4 MG/ML IJ SOLN: 0.4000 mg | INTRAMUSCULAR | Status: DC | PRN

## 2017-06-19 MED ORDER — FENTANYL 2.5 MCG/ML BUPIVACAINE 1/10 % EPIDURAL INFUSION (WH - ANES)
14.0000 mL/h | INTRAMUSCULAR | Status: DC | PRN
  Administered 2017-06-19: 14 mL/h via EPIDURAL
  Filled 2017-06-19: qty 100

## 2017-06-19 MED ORDER — LACTATED RINGERS IV SOLN
500.0000 mL | Freq: Once | INTRAVENOUS | Status: AC
  Administered 2017-06-19: 500 mL via INTRAVENOUS

## 2017-06-19 MED ORDER — MAGNESIUM HYDROXIDE 400 MG/5ML PO SUSP: 30.0000 mL | ORAL | Status: DC | PRN

## 2017-06-19 MED ORDER — KETOROLAC TROMETHAMINE 30 MG/ML IJ SOLN
INTRAMUSCULAR | Status: AC
  Filled 2017-06-19: qty 1

## 2017-06-19 MED ORDER — COCONUT OIL OIL: 1.0000 "application " | TOPICAL_OIL | Status: DC | PRN

## 2017-06-19 MED ORDER — CEFAZOLIN SODIUM-DEXTROSE 2-4 GM/100ML-% IV SOLN
2.0000 g | INTRAVENOUS | Status: AC
  Administered 2017-06-19: 2 g via INTRAVENOUS
  Filled 2017-06-19: qty 100

## 2017-06-19 MED ORDER — FERROUS SULFATE 325 (65 FE) MG PO TABS
325.0000 mg | ORAL_TABLET | Freq: Two times a day (BID) | ORAL | Status: DC
  Administered 2017-06-20 – 2017-06-21 (×3): 325 mg via ORAL
  Filled 2017-06-19 (×3): qty 1

## 2017-06-19 MED ORDER — EPHEDRINE 5 MG/ML INJ: 10.0000 mg | INTRAVENOUS | Status: DC | PRN

## 2017-06-19 MED ORDER — ENOXAPARIN SODIUM 40 MG/0.4ML ~~LOC~~ SOLN
40.0000 mg | SUBCUTANEOUS | Status: DC
  Administered 2017-06-20 – 2017-06-21 (×2): 40 mg via SUBCUTANEOUS
  Filled 2017-06-19 (×2): qty 0.4

## 2017-06-19 MED ORDER — LIDOCAINE-EPINEPHRINE (PF) 2 %-1:200000 IJ SOLN
INTRAMUSCULAR | Status: AC
  Filled 2017-06-19: qty 20

## 2017-06-19 MED ORDER — PHENYLEPHRINE 40 MCG/ML (10ML) SYRINGE FOR IV PUSH (FOR BLOOD PRESSURE SUPPORT)
80.0000 ug | PREFILLED_SYRINGE | INTRAVENOUS | Status: DC | PRN
  Filled 2017-06-19: qty 10

## 2017-06-19 MED ORDER — SCOPOLAMINE 1 MG/3DAYS TD PT72
MEDICATED_PATCH | TRANSDERMAL | Status: AC
  Filled 2017-06-19: qty 1

## 2017-06-19 MED ORDER — ONDANSETRON HCL 4 MG/2ML IJ SOLN
INTRAMUSCULAR | Status: DC | PRN
  Administered 2017-06-19: 4 mg via INTRAVENOUS

## 2017-06-19 MED ORDER — MORPHINE SULFATE (PF) 0.5 MG/ML IJ SOLN
INTRAMUSCULAR | Status: DC | PRN
  Administered 2017-06-19: 3 mg via EPIDURAL

## 2017-06-19 MED ORDER — SENNOSIDES-DOCUSATE SODIUM 8.6-50 MG PO TABS
2.0000 | ORAL_TABLET | ORAL | Status: DC
  Administered 2017-06-19 – 2017-06-20 (×2): 2 via ORAL
  Filled 2017-06-19 (×2): qty 2

## 2017-06-19 MED ORDER — PRENATAL MULTIVITAMIN CH
1.0000 | ORAL_TABLET | Freq: Every day | ORAL | Status: DC
  Administered 2017-06-20 – 2017-06-21 (×2): 1 via ORAL
  Filled 2017-06-19 (×2): qty 1

## 2017-06-19 MED ORDER — SODIUM CHLORIDE 0.9 % IV SOLN
500.0000 mg | INTRAVENOUS | Status: AC
  Administered 2017-06-19: 500 mg via INTRAVENOUS
  Filled 2017-06-19: qty 500

## 2017-06-19 MED ORDER — DIBUCAINE 1 % RE OINT: 1.0000 "application " | TOPICAL_OINTMENT | RECTAL | Status: DC | PRN

## 2017-06-19 MED ORDER — ONDANSETRON HCL 4 MG/2ML IJ SOLN: 4.0000 mg | Freq: Three times a day (TID) | INTRAMUSCULAR | Status: DC | PRN

## 2017-06-19 MED ORDER — SCOPOLAMINE 1 MG/3DAYS TD PT72
MEDICATED_PATCH | TRANSDERMAL | Status: DC | PRN
  Administered 2017-06-19: 1 via TRANSDERMAL

## 2017-06-19 MED ORDER — METOCLOPRAMIDE HCL 5 MG/ML IJ SOLN: 10.0000 mg | Freq: Once | INTRAMUSCULAR | Status: DC | PRN

## 2017-06-19 MED ORDER — DEXAMETHASONE SODIUM PHOSPHATE 4 MG/ML IJ SOLN
INTRAMUSCULAR | Status: AC
  Filled 2017-06-19: qty 1

## 2017-06-19 MED ORDER — OXYTOCIN 40 UNITS IN LACTATED RINGERS INFUSION - SIMPLE MED: 2.5000 [IU]/h | INTRAVENOUS | Status: AC

## 2017-06-19 MED ORDER — DIPHENHYDRAMINE HCL 50 MG/ML IJ SOLN: 12.5000 mg | INTRAMUSCULAR | Status: DC | PRN

## 2017-06-19 MED ORDER — LIDOCAINE-EPINEPHRINE 2 %-1:100000 IJ SOLN
INTRAMUSCULAR | Status: DC | PRN
  Administered 2017-06-19 (×2): 5 mL via INTRADERMAL

## 2017-06-19 MED ORDER — DIPHENHYDRAMINE HCL 25 MG PO CAPS
25.0000 mg | ORAL_CAPSULE | ORAL | Status: DC | PRN
  Filled 2017-06-19: qty 1

## 2017-06-19 MED ORDER — KETOROLAC TROMETHAMINE 30 MG/ML IJ SOLN
30.0000 mg | Freq: Four times a day (QID) | INTRAMUSCULAR | Status: AC | PRN
  Administered 2017-06-19: 30 mg via INTRAMUSCULAR

## 2017-06-19 MED ORDER — PHENYLEPHRINE 40 MCG/ML (10ML) SYRINGE FOR IV PUSH (FOR BLOOD PRESSURE SUPPORT): 80.0000 ug | PREFILLED_SYRINGE | INTRAVENOUS | Status: DC | PRN

## 2017-06-19 MED ORDER — SIMETHICONE 80 MG PO CHEW
80.0000 mg | CHEWABLE_TABLET | ORAL | Status: DC
  Administered 2017-06-19 – 2017-06-20 (×2): 80 mg via ORAL
  Filled 2017-06-19 (×2): qty 1

## 2017-06-19 MED ORDER — NALBUPHINE HCL 10 MG/ML IJ SOLN: 5.0000 mg | Freq: Once | INTRAMUSCULAR | Status: AC | PRN

## 2017-06-19 MED ORDER — MORPHINE SULFATE (PF) 0.5 MG/ML IJ SOLN
INTRAMUSCULAR | Status: AC
  Filled 2017-06-19: qty 10

## 2017-06-19 MED ORDER — WITCH HAZEL-GLYCERIN EX PADS: 1.0000 "application " | MEDICATED_PAD | CUTANEOUS | Status: DC | PRN

## 2017-06-19 MED ORDER — DIPHENHYDRAMINE HCL 25 MG PO CAPS: 25.0000 mg | ORAL_CAPSULE | Freq: Four times a day (QID) | ORAL | Status: DC | PRN

## 2017-06-19 MED ORDER — ACETAMINOPHEN 325 MG PO TABS
650.0000 mg | ORAL_TABLET | ORAL | Status: DC | PRN
  Administered 2017-06-20: 650 mg via ORAL
  Filled 2017-06-19: qty 2

## 2017-06-19 MED ORDER — OXYTOCIN 10 UNIT/ML IJ SOLN
INTRAVENOUS | Status: DC | PRN
  Administered 2017-06-19: 40 [IU] via INTRAVENOUS

## 2017-06-19 MED ORDER — MEASLES, MUMPS & RUBELLA VAC ~~LOC~~ INJ
0.5000 mL | INJECTION | Freq: Once | SUBCUTANEOUS | Status: DC
  Filled 2017-06-19: qty 0.5

## 2017-06-19 MED ORDER — OXYCODONE-ACETAMINOPHEN 5-325 MG PO TABS: 1.0000 | ORAL_TABLET | ORAL | Status: DC | PRN

## 2017-06-19 MED ORDER — MENTHOL 3 MG MT LOZG: 1.0000 | LOZENGE | OROMUCOSAL | Status: DC | PRN

## 2017-06-19 MED ORDER — OXYTOCIN 40 UNITS IN LACTATED RINGERS INFUSION - SIMPLE MED: 1.0000 m[IU]/min | INTRAVENOUS | Status: DC

## 2017-06-19 MED ORDER — LIDOCAINE HCL (PF) 1 % IJ SOLN
INTRAMUSCULAR | Status: DC | PRN
  Administered 2017-06-19: 5 mL via EPIDURAL
  Administered 2017-06-19: 7 mL via EPIDURAL

## 2017-06-19 MED ORDER — OXYCODONE-ACETAMINOPHEN 5-325 MG PO TABS: 2.0000 | ORAL_TABLET | ORAL | Status: DC | PRN

## 2017-06-19 MED ORDER — NALBUPHINE HCL 10 MG/ML IJ SOLN
INTRAMUSCULAR | Status: AC
  Filled 2017-06-19: qty 1

## 2017-06-19 MED ORDER — DEXAMETHASONE SODIUM PHOSPHATE 4 MG/ML IJ SOLN
INTRAMUSCULAR | Status: DC | PRN
  Administered 2017-06-19: 4 mg via INTRAVENOUS

## 2017-06-19 SURGICAL SUPPLY — 32 items
CELLS DAT CNTRL 66122 CELL SVR (MISCELLANEOUS) ×1 IMPLANT
CHLORAPREP W/TINT 26ML (MISCELLANEOUS) ×2 IMPLANT
CLAMP CORD UMBIL (MISCELLANEOUS) IMPLANT
CLOTH BEACON ORANGE TIMEOUT ST (SAFETY) ×2 IMPLANT
DRSG OPSITE POSTOP 4X10 (GAUZE/BANDAGES/DRESSINGS) ×2 IMPLANT
ELECT REM PT RETURN 9FT ADLT (ELECTROSURGICAL) ×2
ELECTRODE REM PT RTRN 9FT ADLT (ELECTROSURGICAL) ×1 IMPLANT
EXTRACTOR VACUUM M CUP 4 TUBE (SUCTIONS) IMPLANT
GLOVE BIOGEL PI IND STRL 7.0 (GLOVE) ×3 IMPLANT
GLOVE BIOGEL PI INDICATOR 7.0 (GLOVE) ×3
GLOVE ECLIPSE 7.0 STRL STRAW (GLOVE) ×2 IMPLANT
GOWN STRL REUS W/TWL LRG LVL3 (GOWN DISPOSABLE) ×4 IMPLANT
KIT ABG SYR 3ML LUER SLIP (SYRINGE) IMPLANT
NEEDLE HYPO 22GX1.5 SAFETY (NEEDLE) ×2 IMPLANT
NEEDLE HYPO 25X5/8 SAFETYGLIDE (NEEDLE) ×2 IMPLANT
NS IRRIG 1000ML POUR BTL (IV SOLUTION) ×2 IMPLANT
PACK C SECTION WH (CUSTOM PROCEDURE TRAY) ×2 IMPLANT
PAD ABD 7.5X8 STRL (GAUZE/BANDAGES/DRESSINGS) ×2 IMPLANT
PAD ABD 8X7 1/2 STERILE (GAUZE/BANDAGES/DRESSINGS) ×2 IMPLANT
PAD OB MATERNITY 4.3X12.25 (PERSONAL CARE ITEMS) ×2 IMPLANT
PENCIL SMOKE EVAC W/HOLSTER (ELECTROSURGICAL) ×2 IMPLANT
RTRCTR C-SECT PINK 25CM LRG (MISCELLANEOUS) IMPLANT
RTRCTR WOUND ALEXIS 18CM MED (MISCELLANEOUS) ×2
SPONGE GAUZE 4X4 FOR O.R. (GAUZE/BANDAGES/DRESSINGS) ×4 IMPLANT
SUT PDS AB 0 CTX 36 PDP370T (SUTURE) IMPLANT
SUT PLAIN 2 0 XLH (SUTURE) IMPLANT
SUT VIC AB 0 CTX 36 (SUTURE) ×2
SUT VIC AB 0 CTX36XBRD ANBCTRL (SUTURE) ×2 IMPLANT
SUT VIC AB 4-0 KS 27 (SUTURE) ×2 IMPLANT
SYR CONTROL 10ML LL (SYRINGE) ×2 IMPLANT
TOWEL OR 17X24 6PK STRL BLUE (TOWEL DISPOSABLE) ×2 IMPLANT
TRAY FOLEY W/BAG SLVR 14FR LF (SET/KITS/TRAYS/PACK) ×2 IMPLANT

## 2017-06-19 NOTE — Anesthesia Preprocedure Evaluation (Addendum)
Anesthesia Evaluation  Patient identified by MRN, date of birth, ID band Patient awake    Reviewed: Allergy & Precautions, H&P , NPO status , Patient's Chart, lab work & pertinent test results  Airway Mallampati: II  TM Distance: >3 FB Neck ROM: full    Dental no notable dental hx. (+) Teeth Intact   Pulmonary former smoker,    Pulmonary exam normal breath sounds clear to auscultation       Cardiovascular negative cardio ROS Normal cardiovascular exam Rhythm:regular Rate:Normal     Neuro/Psych negative neurological ROS  negative psych ROS   GI/Hepatic negative GI ROS, Neg liver ROS,   Endo/Other  negative endocrine ROS  Renal/GU negative Renal ROS  negative genitourinary   Musculoskeletal negative musculoskeletal ROS (+)   Abdominal (+) + obese,   Peds  Hematology negative hematology ROS (+)   Anesthesia Other Findings   Reproductive/Obstetrics (+) Pregnancy                             Anesthesia Physical Anesthesia Plan  ASA: II and emergent  Anesthesia Plan: Epidural   Post-op Pain Management:    Induction:   PONV Risk Score and Plan: 4 or greater and Scopolamine patch - Pre-op, Dexamethasone, Ondansetron and Treatment may vary due to age or medical condition  Airway Management Planned: Natural Airway and Nasal Cannula  Additional Equipment:   Intra-op Plan:   Post-operative Plan:   Informed Consent: I have reviewed the patients History and Physical, chart, labs and discussed the procedure including the risks, benefits and alternatives for the proposed anesthesia with the patient or authorized representative who has indicated his/her understanding and acceptance.     Plan Discussed with: CRNA, Anesthesiologist and Surgeon  Anesthesia Plan Comments: (Patient for urgent C/Section for non reassuring FHR and fetal intolerance to labor. Will use epidural for C/Section. M.  Malen Gauze, MD)       Anesthesia Quick Evaluation

## 2017-06-19 NOTE — Transfer of Care (Signed)
Immediate Anesthesia Transfer of Care Note  Patient: Yolanda Martinez  Procedure(s) Performed: CESAREAN SECTION (N/A )  Patient Location: PACU  Anesthesia Type:Epidural  Level of Consciousness: awake, alert , oriented and patient cooperative  Airway & Oxygen Therapy: Patient Spontanous Breathing  Post-op Assessment: Report given to RN and Post -op Vital signs reviewed and stable  Post vital signs: Reviewed and stable  Last Vitals:  Vitals Value Taken Time  BP    Temp    Pulse 79 06/19/2017 10:37 AM  Resp 26 06/19/2017 10:37 AM  SpO2 100 % 06/19/2017 10:37 AM  Vitals shown include unvalidated device data.  Last Pain:  Vitals:   06/19/17 0600  TempSrc: Oral  PainSc:          Complications: No apparent anesthesia complications

## 2017-06-19 NOTE — Addendum Note (Signed)
Addendum  created 06/19/17 1800 by Graciela Husbands, CRNA   Intraprocedure LDAs edited, LDA properties accepted

## 2017-06-19 NOTE — Op Note (Signed)
Yolanda Martinez PROCEDURE DATE: 06/19/2017  PREOPERATIVE DIAGNOSES: Intrauterine pregnancy at [redacted]w[redacted]d weeks gestation; failed induction for premature rupture of membranes (PROM), non-reassuring fetal status and fetal intolerance of labor  POSTOPERATIVE DIAGNOSES: The same  PROCEDURE: Primary Low Transverse Cesarean Section  SURGEON:  Dr. Jaynie Collins  ASSISTANT:  Dr. Raynelle Fanning Degele  ANESTHESIOLOGY TEAM: Anesthesiologist: Mal Amabile, MD; Leilani Able, MD  INDICATIONS: Yolanda Martinez is a 22 y.o. G1P0 at [redacted]w[redacted]d here for cesarean section secondary to the indications listed under preoperative diagnoses; please see preoperative note for further details.  The risks of cesarean section were discussed with the patient including but were not limited to: bleeding which may require transfusion or reoperation; infection which may require antibiotics; injury to bowel, bladder, ureters or other surrounding organs; injury to the fetus; need for additional procedures including hysterectomy in the event of a life-threatening hemorrhage; placental abnormalities wth subsequent pregnancies, incisional problems, thromboembolic phenomenon and other postoperative/anesthesia complications.   The patient concurred with the proposed plan, giving informed written consent for the procedure.    FINDINGS:  Viable female infant in cephalic presentation; direct occiput position.  Apgars 8 and 9.  Clear amniotic fluid.  Intact placenta, three vessel cord.  Normal uterus, fallopian tubes and ovaries bilaterally.  ANESTHESIA: Epidural  INTRAVENOUS FLUIDS: 1200 ml   ESTIMATED BLOOD LOSS: 600 ml URINE OUTPUT:  200 ml SPECIMENS: Placenta sent to pathology COMPLICATIONS: None immediate  PROCEDURE IN DETAIL:  The patient preoperatively received intravenous antibiotics and had sequential compression devices applied to her lower extremities.  She was then taken to the operating room where the epidural anesthesia was dosed up  to surgical level and was found to be adequate. She was then placed in a dorsal supine position with a leftward tilt, and prepped and draped in a sterile manner.  A foley catheter was placed into her bladder and attached to constant gravity.  After an adequate timeout was performed, a Pfannenstiel skin incision was made with scalpel and carried through to the underlying layer of fascia. The fascia was incised in the midline, and this incision was extended bilaterally using the Mayo scissors.  Kocher clamps were applied to the superior aspect of the fascial incision and the underlying rectus muscles were dissected off bluntly.  A similar process was carried out on the inferior aspect of the fascial incision. The rectus muscles were separated in the midline and the peritoneum was entered bluntly. The Alexis self-retaining retractor was introduced into the abdominal cavity.  Attention was turned to the lower uterine segment where a low transverse hysterotomy was made with a scalpel and extended bilaterally bluntly.  The infant was successfully delivered, the cord was clamped and cut after one minute, and the infant was handed over to the awaiting neonatology team. Uterine massage was then administered, and the placenta delivered intact with a three-vessel cord. The uterus was then cleared of clots and debris.  The hysterotomy was closed with 0 Vicryl in a running locked fashion, and an imbricating layer was also placed with 0 Vicryl.  Figure-of-eight 0 Vicryl serosal stitches were placed to help with hemostasis.  The pelvis was cleared of all clot and debris. Hemostasis was confirmed on all surfaces.  The retractor was removed.  The peritoneum and the rectus muscles were reapproximated using 0 Vicryl interrupted stitches. The fascia was then closed using 0 Vicryl in a running fashion.  The subcutaneous layer was irrigated and the skin was closed with a 4-0 Vicryl subcuticular stitch. The patient  tolerated the procedure  well. Sponge, instrument and needle counts were correct x 3.  She was taken to the recovery room in stable condition.   An experienced assistant was required given the standard of surgical care given the complexity of the case.  This assistant was needed for exposure, dissection, suctioning, retraction, instrument exchange, assisting with delivery with administration of fundal pressure, and for overall help during the procedure.   Jaynie Collins, MD, FACOG Obstetrician & Gynecologist, [facility] for [employer], [facility]

## 2017-06-19 NOTE — Progress Notes (Signed)
Yolanda Martinez is a 22 y.o. G1P0 at [redacted]w[redacted]d by LMP admitted for PROM  Subjective: Patient with epidural and comfortable at this time. She was on pitocin, but developed period of tachysystole with recurrent variables. Pitocin was turned off and FHT improved. Appears to be very sensitive to pitocin.   Objective: BP 114/66   Pulse 80   Temp 98.7 F (37.1 C) (Oral)   Resp 16   Ht  (1.575 m)   Wt 90.7 kg (200 lb)   LMP 09/10/2016 (Exact Date) Comment: Unsure of LMP  SpO2 100%   BMI 36.58 kg/m  No intake/output data recorded. No intake/output data recorded.  FHT:  FHR: 120 bpm, variability: mild to moderate,  accelerations:  Present,  decelerations:  Present recurrent variables with periods of tachysytole UC:   regular, every 5 minutes SVE:   Dilation: 6 Effacement (%): 60 Station: -2 Exam by:: Orvan Falconer, MD  Labs: Lab Results  Component Value Date   WBC 7.3 06/18/2017   HGB 11.9 (L) 06/18/2017   HCT 35.2 (L) 06/18/2017   MCV 88.4 06/18/2017   PLT 152 06/18/2017    Assessment / Plan: Admitted with PROM. Currently undergling induction of labor.   Labor: FHT has been reassurring for >60 minutes. Will restart pitocin with plans to titrate 1x1.  Preeclampsia:  no signs or symptoms of toxicity Fetal Wellbeing:  Category II Pain Control:  Epidural I/D:  GBS Positive. Receiving penicillin Anticipated MOD:  NSVD  Gorden Harms 06/19/2017, 5:38 AM

## 2017-06-19 NOTE — Addendum Note (Signed)
Addendum  created 06/19/17 1418 by Graciela Husbands, CRNA   Sign clinical note

## 2017-06-19 NOTE — Anesthesia Pain Management Evaluation Note (Signed)
  CRNA Pain Management Visit Note  Patient: Yolanda Martinez, 22 y.o., female  "Hello I am a member of the anesthesia team at [facility]. We have an anesthesia team available at all times to provide care throughout the hospital, including epidural management and anesthesia for C-section. I don't know your plan for the delivery whether it a natural birth, water birth, IV sedation, nitrous supplementation, doula or epidural, but we want to meet your pain goals."   1.Was your pain managed to your expectations on prior hospitalizations?   No prior hospitalizations  2.What is your expectation for pain management during this hospitalization?     Epidural  3.How can we help you reach that goal? support  Record the patient's initial score and the patient's pain goal.   Pain: 6  Pain Goal: 9 [facility] wants you to be able to say your pain was always managed very well.  Trellis Paganini 06/19/2017

## 2017-06-19 NOTE — Anesthesia Postprocedure Evaluation (Signed)
Anesthesia Post Note  Patient: Yolanda Martinez  Procedure(s) Performed: CESAREAN SECTION (N/A )     Patient location during evaluation: Mother Baby Anesthesia Type: Epidural Level of consciousness: awake and alert and oriented Pain management: satisfactory to patient Vital Signs Assessment: post-procedure vital signs reviewed and stable Respiratory status: respiratory function stable Cardiovascular status: stable Postop Assessment: no headache, no backache, epidural receding, patient able to bend at knees, no signs of nausea or vomiting and adequate PO intake Anesthetic complications: no    Last Vitals:  Vitals:   06/19/17 1200 06/19/17 1315  BP:  126/71  Pulse: 66 (!) 58  Resp: 12 16  Temp:  36.6 C  SpO2: 100% 99%    Last Pain:  Vitals:   06/19/17 1315  TempSrc: Oral  PainSc: 0-No pain   Pain Goal:                 Yumi Insalaco

## 2017-06-19 NOTE — Anesthesia Postprocedure Evaluation (Signed)
Anesthesia Post Note  Patient: Yolanda Martinez  Procedure(s) Performed: CESAREAN SECTION (N/A )     Patient location during evaluation: PACU Anesthesia Type: Epidural Level of consciousness: awake and alert and oriented Pain management: pain level controlled Vital Signs Assessment: post-procedure vital signs reviewed and stable Respiratory status: spontaneous breathing, nonlabored ventilation and respiratory function stable Cardiovascular status: stable Postop Assessment: no headache, no backache, epidural receding, patient able to bend at knees and no apparent nausea or vomiting Anesthetic complications: no    Last Vitals:  Vitals:   06/19/17 1149 06/19/17 1150  BP:    Pulse: (!) 59 68  Resp: 19 (!) 23  Temp:    SpO2: 100% 100%    Last Pain:  Vitals:   06/19/17 1037  TempSrc:   PainSc: 0-No pain   Pain Goal:                 Coulter Oldaker A.

## 2017-06-19 NOTE — Progress Notes (Signed)
Vitals:   06/18/17 2231 06/18/17 2301  BP: 125/70 127/71  Pulse: 94 73  Resp:    Temp:    SpO2:     Having some variables and late decels despite position changes/IVF bolus. ? Tachysystole, ctx pattern a little difficult to interpret but seems to be q 1-2 minutes. Pt tolerating well.  Pit was on 34mu/,min, turned off. Forebag ruptured and IUPC inserted by Barbarann Ehlers, MD.  Amnioinfusion started Cx 5-6/80/-2.  Will restart Pit (if needed) after Cat1 EFM

## 2017-06-19 NOTE — Progress Notes (Signed)
Faculty Practice OB/GYN Attending Note  22 y.o. G1P0 at [redacted]w[redacted]d admitted for PROM and IOL; with fetal intolerance of labor and continued non-reassuring fetal heart tracing. Reviewed labor course and current  FHR tracing: 120 bpm, minimal variability, no accels or decels currently. Has repetitive decels when pitocin is above 4-6 mu/min.  Patient was counseled regarding cesarean delivery.  The risks of cesarean section discussed with the patient included but were not limited to: bleeding which may require transfusion or reoperation; infection which may require antibiotics; injury to bowel, bladder, ureters or other surrounding organs; injury to the fetus; need for additional procedures including hysterectomy in the event of a life-threatening hemorrhage; placental abnormalities wth subsequent pregnancies, incisional problems, thromboembolic phenomenon and other postoperative/anesthesia complications. The patient concurred with the proposed plan, giving informed written consent for the procedure.    Anesthesia and OR aware. Preoperative prophylactic antibiotics and SCDs ordered on call to the OR.  To OR when ready.   Jaynie Collins, MD, FACOG Obstetrician & Gynecologist, [facility] for [employer], [facility]

## 2017-06-19 NOTE — Progress Notes (Signed)
Notified by nursing that patient's IUPC nearly came out after patient went to the restroom. IUPC replaced to allow for continuation of amnioinfusion. FHT reassuring at this time without recurrence of variables. Will restart pitocin at this time as well.

## 2017-06-19 NOTE — Progress Notes (Signed)
Yolanda Martinez is a 22 y.o. G1P0 at [redacted]w[redacted]d by LMP admitted for rupture of membranes  Subjective:   Objective: BP 127/71   Pulse 73   Temp 98.4 F (36.9 C) (Oral)   Resp 16   Ht  (1.575 m)   Wt 90.7 kg (200 lb)   LMP 09/10/2016 (Exact Date) Comment: Unsure of LMP  SpO2 100%   BMI 36.58 kg/m  No intake/output data recorded. No intake/output data recorded.  FHT:  FHR: 140 bpm, variability: moderate,  accelerations:  Present,  decelerations:  Present recurrent variables UC:   regular, every 1-3 minutes SVE:   Dilation: 5 Effacement (%): 70 Station: -2, -3 Exam by:: hutchison, rn  Labs: Lab Results  Component Value Date   WBC 7.3 06/18/2017   HGB 11.9 (L) 06/18/2017   HCT 35.2 (L) 06/18/2017   MCV 88.4 06/18/2017   PLT 152 06/18/2017    Assessment / Plan: Admitted with ruptured membranes. Currently undergoing induction of labor.   Labor: Foley bulb removed around 2200. Patient started on pitocin. Titrated to 6, but patient had recurrent variable decelerations on FHT, despite fluid bolus and repositioning. AROM of remainin forebag attempted with minimal fluid loss. IUPC placed. Will start amnioinfusion.  Preeclampsia:  no signs or symptoms of toxicity Fetal Wellbeing:  Category II Pain Control:  Labor support without medications I/D:  GBS Positive. Receiving penicillin Anticipated MOD:  NSVD  Gorden Harms 06/19/2017, 12:15 AM

## 2017-06-19 NOTE — Anesthesia Procedure Notes (Signed)
Epidural Patient location during procedure: OB Start time: 06/19/2017 3:47 AM End time: 06/19/2017 3:51 AM  Staffing Anesthesiologist: Leilani Able, MD Performed: anesthesiologist   Preanesthetic Checklist Completed: patient identified, site marked, surgical consent, pre-op evaluation, timeout performed, IV checked, risks and benefits discussed and monitors and equipment checked  Epidural Patient position: sitting Prep: site prepped and draped and DuraPrep Patient monitoring: continuous pulse ox and blood pressure Approach: midline Location: L3-L4 Injection technique: LOR air  Needle:  Needle type: Tuohy  Needle gauge: 17 G Needle length: 9 cm and 9 Needle insertion depth: 7 cm Catheter type: closed end flexible Catheter size: 19 Gauge Catheter at skin depth: 12 cm Test dose: negative and Other  Assessment Sensory level: T9 Events: blood not aspirated, injection not painful, no injection resistance, negative IV test and no paresthesia  Additional Notes Reason for block:procedure for pain

## 2017-06-20 ENCOUNTER — Encounter (HOSPITAL_COMMUNITY): Payer: Self-pay | Admitting: Obstetrics & Gynecology

## 2017-06-20 ENCOUNTER — Other Ambulatory Visit: Payer: Self-pay

## 2017-06-20 LAB — CREATININE, SERUM
Creatinine, Ser: 0.59 mg/dL (ref 0.44–1.00)
GFR calc non Af Amer: >60 mL/min (ref >60)

## 2017-06-20 LAB — CBC
HEMATOCRIT: 29.9 % — AB (ref 36.0–46.0)
HEMOGLOBIN: 10 g/dL — AB (ref 12.0–15.0)
MCH: 29.3 pg (ref 26.0–34.0)
MCHC: 33.4 g/dL (ref 30.0–36.0)
MCV: 87.7 fL (ref 78.0–100.0)
PLATELETS: 136 10*3/uL — AB (ref 150–400)
RBC: 3.41 MIL/uL — AB (ref 3.87–5.11)
RDW: 13.9 % (ref 11.5–15.5)
WBC: 11.2 10*3/uL — AB (ref 4.0–10.5)

## 2017-06-20 NOTE — Progress Notes (Signed)
Subjective: Postpartum Day #1: Cesarean Delivery Patient reports tolerating PO and no problems voiding. Bottlefeeding; desires Nexplanon for contraception. No dizziness or difficulty with voiding now that foley is out.  Objective: Vital signs in last 24 hours: Temp:  [97.6 F (36.4 C)-98.8 F (37.1 C)] 98.3 F (36.8 C) (05/04 0337) Pulse Rate:  [57-80] 69 (05/04 0337) Resp:  [0-26] 18 (05/04 0337) BP: (98-144)/(52-92) 124/74 (05/04 0337) SpO2:  [91 %-100 %] 100 % (05/04 0337) Weight:  [90.7 kg (199 lb 15.3 oz)] 90.7 kg (199 lb 15.3 oz) (05/03 1200)  Physical Exam:  General: alert, cooperative and no distress Lochia: appropriate Uterine Fundus: firm Incision: pressure dsg intact, dry DVT Evaluation: No evidence of DVT seen on physical exam.  Recent Labs    06/18/17 1841 06/20/17 0551  HGB 11.9* 10.0*  HCT 35.2* 29.9*   Creatine: 0.59   Assessment/Plan: Status post Cesarean section. Doing well postoperatively.  Continue current care. Anticipate d/c 06/21/17.  Cam Hai CNM 06/20/2017, 7:19 AM

## 2017-06-21 DIAGNOSIS — Z98891 History of uterine scar from previous surgery: Secondary | ICD-10-CM

## 2017-06-21 HISTORY — DX: History of uterine scar from previous surgery: Z98.891

## 2017-06-21 MED ORDER — IBUPROFEN 600 MG PO TABS: 600.0000 mg | ORAL_TABLET | Freq: Four times a day (QID) | ORAL | 0 refills | Status: DC

## 2017-06-21 MED ORDER — OXYCODONE-ACETAMINOPHEN 5-325 MG PO TABS: 1.0000 | ORAL_TABLET | ORAL | 0 refills | Status: DC | PRN

## 2017-06-21 NOTE — Discharge Summary (Addendum)
OB Discharge Summary     Patient Name: Yolanda Martinez DOB: [birth year] MRN: [id number]  Date of admission: 06/18/2017 Delivering MD: Jaynie Collins A   Date of discharge: 06/21/2017  Admitting diagnosis: WATER BROKE Intrauterine pregnancy: [redacted]w[redacted]d     Secondary diagnosis:  Principal Problem:   Status post primary low transverse cesarean section Active Problems:   Labor and delivery, indication for care  Additional problems: none     Discharge diagnosis: Term Pregnancy Delivered  Via C-section for NRFHT                                                                                        Post partum procedures:none  Augmentation: Pitocin and Foley Balloon  Complications: None  Hospital course:  Onset of Labor With Unplanned C/S  22 y.o. yo G1P1001 at [redacted]w[redacted]d was admitted in Latent Labor on 06/18/2017. Patient had a labor course significant for NRFHT. Membrane Rupture Time/Date: 12:05 AM ,06/19/2017   The patient went for cesarean section due to Non-Reassuring FHR, and delivered a Viable infant,06/19/2017  Details of operation can be found in separate operative note. Patient had an uncomplicated postpartum course.  She is ambulating,tolerating a regular diet, passing flatus, and urinating well.  Patient is discharged home in stable condition 06/21/17.  Physical exam  Vitals:   06/20/17 0337 06/20/17 0753 06/20/17 1744 06/21/17 0622  BP: 124/74 119/64 127/67 120/82  Pulse: 69 61 63 (!) 56  Resp: Temp: 98.3 F (36.8 C) 98 F (36.7 C) 98 F (36.7 C) 98.4 F (36.9 C)  TempSrc: Oral Oral Oral Oral  SpO2: 100% 100%    Weight:      Height:       General: alert, cooperative and no distress Lochia: appropriate Uterine Fundus: firm Incision: Dressing is clean, dry, and intact DVT Evaluation: No evidence of DVT seen on physical exam. Negative Homan's sign. Labs: Lab Results  Component Value Date   WBC 11.2 (H) 06/20/2017   HGB 10.0 (L) 06/20/2017   HCT 29.9 (L)  06/20/2017   MCV 87.7 06/20/2017   PLT 136 (L) 06/20/2017   CMP Latest Ref Rng & Units 06/20/2017  Glucose 65 - 99 mg/dL -  BUN 6 - 20 mg/dL -  Creatinine 7.82 - 9.56 mg/dL 2.13  Sodium 086 - 578 mmol/L -  Potassium 3.5 - 5.1 mmol/L -  Chloride 101 - 111 mmol/L -  CO2 22 - 32 mmol/L -  Calcium 8.9 - 10.3 mg/dL -  Total Protein 6.5 - 8.1 g/dL -  Total Bilirubin 0.3 - 1.2 mg/dL -  Alkaline Phos 38 - 469 U/L -  AST 15 - 41 U/L -  ALT 14 - 54 U/L -    Discharge instruction: per After Visit Summary and "Baby and Me Booklet".  After visit meds:  Allergies as of 06/21/2017   No Known Allergies     Medication List    STOP taking these medications   prenatal multivitamin Tabs tablet     TAKE these medications   ibuprofen 600 MG tablet Commonly known as:  ADVIL,MOTRIN Take 1 tablet (600 mg total) by  mouth every 6 (six) hours.   oxyCODONE-acetaminophen 5-325 MG tablet Commonly known as:  PERCOCET/ROXICET Take 1 tablet by mouth every 4 (four) hours as needed (pain scale 4-7).       Diet: routine diet  Activity: Advance as tolerated. Pelvic rest for 6 weeks.   Outpatient follow up:2 weeks for wound check and 6 weeks for PP  Postpartum contraception: Nexplanon  Newborn Data: Live born female  Birth Weight: 7 lb 3.9 oz (3286 g) APGAR: 8, 9  Newborn Delivery   Birth date/time:  06/19/2017 09:48:00 Delivery type:  C-Section, Low Transverse Trial of labor:  Yes C-section categorization:  Primary     Baby Feeding: Bottle Disposition:home with mother   06/21/2017 Myrene Buddy, MD  OB FELLOW DISCHARGE ATTESTATION  I have seen and examined this patient and agree with above documentation in the resident's note.   Frederik Pear, MD OB Fellow 2:20 PM

## 2017-06-21 NOTE — Progress Notes (Signed)
CSW received consult for patient for prior substance use. Patient reported to her OBGYN in September that she formerly used marijuana. Patient found out she was pregnant at five weeks and immediately stopped smoking marijuana. Patient does not have any positive UDS in her chart. Patient's newborn, Cathalina Barcia was also negative at birth. CSW explained drug screening policies for urine and cord tissue, patient stated understanding and agreement. Patient is bottle feeding, receives [facility] and Medicaid. Patient resides with her fiance. CSW did not complete full psychosocial assessment due to patient not having any documented positive UDS. CSW will monitor cord tissue test results and make a notification to CPS if necessary. CSW encouraged patient to reach out to CSW department before or after discharge if questions or needs arise.  Edwin Dada, MSW, LCSW-A Clinical Social Worker [facility] [phone]

## 2017-06-28 ENCOUNTER — Inpatient Hospital Stay (HOSPITAL_COMMUNITY): Admission: RE | Admit: 2017-06-28 | Payer: BLUE CROSS/BLUE SHIELD | Source: Ambulatory Visit

## 2018-03-21 ENCOUNTER — Encounter (HOSPITAL_COMMUNITY): Payer: Self-pay

## 2018-03-21 ENCOUNTER — Ambulatory Visit (HOSPITAL_COMMUNITY)
Admission: EM | Admit: 2018-03-21 | Discharge: 2018-03-21 | Disposition: A | Discharge status: Home / Self Care | Payer: BLUE CROSS/BLUE SHIELD | Attending: Emergency Medicine | Admitting: Emergency Medicine

## 2018-03-21 ENCOUNTER — Other Ambulatory Visit: Payer: Self-pay

## 2018-03-21 DIAGNOSIS — R52 Pain, unspecified: Secondary | ICD-10-CM

## 2018-03-21 DIAGNOSIS — R69 Illness, unspecified: Secondary | ICD-10-CM | POA: Diagnosis not present

## 2018-03-21 DIAGNOSIS — R110 Nausea: Secondary | ICD-10-CM

## 2018-03-21 DIAGNOSIS — R05 Cough: Secondary | ICD-10-CM

## 2018-03-21 DIAGNOSIS — J111 Influenza due to unidentified influenza virus with other respiratory manifestations: Secondary | ICD-10-CM

## 2018-03-21 DIAGNOSIS — J029 Acute pharyngitis, unspecified: Secondary | ICD-10-CM

## 2018-03-21 DIAGNOSIS — R51 Headache: Secondary | ICD-10-CM | POA: Diagnosis not present

## 2018-03-21 MED ORDER — ONDANSETRON 8 MG PO TBDP: ORAL_TABLET | ORAL | 0 refills | Status: DC

## 2018-03-21 MED ORDER — HYDROCOD POLST-CPM POLST ER 10-8 MG/5ML PO SUER: 5.0000 mL | Freq: Two times a day (BID) | ORAL | 0 refills | Status: DC | PRN

## 2018-03-21 MED ORDER — IBUPROFEN 600 MG PO TABS: 600.0000 mg | ORAL_TABLET | Freq: Four times a day (QID) | ORAL | 0 refills | Status: DC | PRN

## 2018-03-21 MED ORDER — BENZONATATE 200 MG PO CAPS: 200.0000 mg | ORAL_CAPSULE | Freq: Three times a day (TID) | ORAL | 0 refills | Status: DC | PRN

## 2018-03-21 MED ORDER — OSELTAMIVIR PHOSPHATE 75 MG PO CAPS: 75.0000 mg | ORAL_CAPSULE | Freq: Two times a day (BID) | ORAL | 0 refills | Status: DC

## 2018-03-21 NOTE — ED Provider Notes (Signed)
HPI  SUBJECTIVE:  Yolanda Martinez is a 23 y.o. female who presents with nonproductive cough starting yesterday with some posttussive emesis.  She denies any other vomiting.  States that she is tolerating liquids.  She reports nausea.  She reports body aches, frontal headache, sore throat which she thinks is from the cough starting today.  No fevers, sinus pain or pressure, nasal congestion, rhinorrhea, postnasal drip.  She reports sharp chest pain lasting seconds present with coughing only. unable to sleep last night because of cough.  No wheezing, shortness of breath, dyspnea on exertion.  She denies neck stiffness.  No abdominal pain, diarrhea, constipation. she is not sure if she has had any contacts with the flu.  She did not get a flu shot this year.  No antipyretic in the past 4 to 6 hours, no antibiotics in the past month.  She tried ibuprofen 200 mg with improvement in her symptoms.  No aggravating factors.  Past medical history of smoking, no history of asthma, hypertension, diabetes, GERD.  PE: Last month.  Denies the possibility of being pregnant.  PMD: Health department.   Past Medical History:  Diagnosis Date  . Medical history non-contributory   . Vaginal Pap smear, abnormal     Past Surgical History:  Procedure Laterality Date  . CESAREAN SECTION N/A 06/19/2017   Procedure: CESAREAN SECTION;  Surgeon: Tereso Newcomer, MD;  Location: [facility];  Service: Obstetrics;  Laterality: N/A;  . NO PAST SURGERIES    . WISDOM TOOTH EXTRACTION      Family History  Problem Relation Age of Onset  . Diabetes Mother   . Diabetes Father     Social History   Tobacco Use  . Smoking status: Former Smoker    Types: Cigars  . Smokeless tobacco: Never Used  . Tobacco comment: quit prior to preg  Substance Use Topics  . Alcohol use: Yes    Comment: none with preg  . Drug use: Not Currently    Types: Marijuana    Comment: Aug 2018    No current facility-administered  medications for this encounter.   Current Outpatient Medications:  .  benzonatate (TESSALON) 200 MG capsule, Take 1 capsule (200 mg total) by mouth 3 (three) times daily as needed for cough., Disp: 30 capsule, Rfl: 0 .  chlorpheniramine-HYDROcodone (TUSSIONEX PENNKINETIC ER) 10-8 MG/5ML SUER, Take 5 mLs by mouth every 12 (twelve) hours as needed for cough., Disp: 60 mL, Rfl: 0 .  ibuprofen (ADVIL,MOTRIN) 600 MG tablet, Take 1 tablet (600 mg total) by mouth every 6 (six) hours as needed., Disp: 30 tablet, Rfl: 0 .  ondansetron (ZOFRAN ODT) 8 MG disintegrating tablet, 1/2- 1 tablet q 8 hr prn nausea, vomiting, Disp: 20 tablet, Rfl: 0 .  oseltamivir (TAMIFLU) 75 MG capsule, Take 1 capsule (75 mg total) by mouth 2 (two) times daily. X 5 days, Disp: 10 capsule, Rfl: 0  No Known Allergies   ROS  As noted in HPI.   Physical Exam  BP 122/61 (BP Location: Right Arm)   Pulse 100   Temp 98.6 F (37 C) (Oral)   Resp 18   Wt 83 kg   LMP 03/03/2018   SpO2 100%   BMI 33.47 kg/m   Constitutional: Well developed, well nourished, no acute distress Eyes: PERRL, EOMI, conjunctiva normal bilaterally HENT: Normocephalic, atraumatic,mucus membranes moist.  Positive nasal congestion.  Mild frontal sinus tenderness.  Erythematous, swollen turbinates.  No maxillary sinus tenderness.  Normal tonsils.  Uvula midline.  No obvious nasal drip, cobblestoning. Neck: No cervical lymphadenopathy, meningismus Respiratory: Clear to auscultation bilaterally, no rales, no wheezing, no rhonchi positive anterior and lateral chest wall tenderness Cardiovascular: Normal rate and rhythm, no murmurs, no gallops, no rubs GI: Soft, nondistended, normal bowel sounds, nontender, no rebound, no guarding Back: no CVAT.  positive upper paralumbar tenderness. skin: No rash, skin intact Musculoskeletal: No edema, no tenderness, no deformities Neurologic: Alert & oriented x 3, CN II-XII grossly intact, no motor deficits, sensation  grossly intact Psychiatric: Speech and behavior appropriate   ED Course   Medications - No data to display  No orders of the defined types were placed in this encounter.  No results found for this or any previous visit (from the past 24 hour(s)). No results found.  ED Clinical Impression  Influenza-like illness   ED Assessment/Plan   [place] Narcotic database reviewed for this patient, and feel that the risk/benefit ratio today is favorable for proceeding with a prescription for controlled substance.  No opiate prescriptions since 2019.  Presentation consistent with an influenza-like illness.  no evidence of otitis, sinusitis, meningitis, pneumonia, intra-abdominal process, UTI.  Abdomen is completely benign.  Will send home with Tamiflu, Tessalon and Tussionex.  Suspect that she will stop vomiting once the cough is controlled.  Also Zofran as she reports occasional nausea.  Ibuprofen 600 mg combined with 1 g of Tylenol 3 or 4 times a day as needed for body aches, chest wall pain.  She has no history of pulmonary disease and her lungs are clear.  Follow-up with PMD if not better in 5 days, to the ER if she gets worse.  Discussed MDM, treatment plan, and plan for follow-up with patient Discussed sn/sx that should prompt return to the ED. patient agrees with plan.   Meds ordered this encounter  Medications  . chlorpheniramine-HYDROcodone (TUSSIONEX PENNKINETIC ER) 10-8 MG/5ML SUER    Sig: Take 5 mLs by mouth every 12 (twelve) hours as needed for cough.    Dispense:  60 mL    Refill:  0  . benzonatate (TESSALON) 200 MG capsule    Sig: Take 1 capsule (200 mg total) by mouth 3 (three) times daily as needed for cough.    Dispense:  30 capsule    Refill:  0  . ibuprofen (ADVIL,MOTRIN) 600 MG tablet    Sig: Take 1 tablet (600 mg total) by mouth every 6 (six) hours as needed.    Dispense:  30 tablet    Refill:  0  . oseltamivir (TAMIFLU) 75 MG capsule    Sig: Take 1 capsule (75 mg total)  by mouth 2 (two) times daily. X 5 days    Dispense:  10 capsule    Refill:  0  . ondansetron (ZOFRAN ODT) 8 MG disintegrating tablet    Sig: 1/2- 1 tablet q 8 hr prn nausea, vomiting    Dispense:  20 tablet    Refill:  0    *This clinic note was created using Scientist, clinical (histocompatibility and immunogenetics). Therefore, there may be occasional mistakes despite careful proofreading.  ?   Domenick Gong, MD 03/21/18 [phone]

## 2018-03-21 NOTE — Discharge Instructions (Addendum)
Make sure you drink at least 2 L of non-sugary, non-caffeinated beverages per day.  Zofran may make you constipated.  Try some stool softeners and see if that does not help.  Tessalon for the cough during the day, Tussionex for the cough at night.  Finish the Tamiflu, even if you feel better.  600 mg of ibuprofen combined with 1 g of Tylenol together 3-4 times a day as needed for chest wall pain, body aches.

## 2018-03-21 NOTE — ED Triage Notes (Signed)
Pt cc vomiting and coughing. Pt states she's not been eating, but has been drinking to stay hydrated.

## 2018-09-29 ENCOUNTER — Other Ambulatory Visit: Payer: Self-pay

## 2018-09-29 ENCOUNTER — Ambulatory Visit (HOSPITAL_COMMUNITY)
Admission: EM | Admit: 2018-09-29 | Discharge: 2018-09-29 | Disposition: A | Discharge status: Home / Self Care | Payer: BC Managed Care – PPO | Attending: Urgent Care | Admitting: Urgent Care

## 2018-09-29 ENCOUNTER — Encounter (HOSPITAL_COMMUNITY): Payer: Self-pay

## 2018-09-29 DIAGNOSIS — M62830 Muscle spasm of back: Secondary | ICD-10-CM | POA: Diagnosis not present

## 2018-09-29 DIAGNOSIS — G8929 Other chronic pain: Secondary | ICD-10-CM

## 2018-09-29 DIAGNOSIS — M545 Low back pain: Secondary | ICD-10-CM | POA: Diagnosis not present

## 2018-09-29 MED ORDER — NAPROXEN 500 MG PO TABS: 500.0000 mg | ORAL_TABLET | Freq: Two times a day (BID) | ORAL | 0 refills | Status: DC

## 2018-09-29 MED ORDER — CYCLOBENZAPRINE HCL 5 MG PO TABS: 5.0000 mg | ORAL_TABLET | Freq: Three times a day (TID) | ORAL | 0 refills | Status: DC | PRN

## 2018-09-29 NOTE — ED Triage Notes (Signed)
Pt states she having back pains. Pt states she woke up with pain worst this morning. Pt states she has been using muscle rub and its not working. This has been going on for a long while.

## 2018-09-29 NOTE — ED Provider Notes (Signed)
MRN: [id number] DOB: 08-25-1995  Subjective:   Yolanda Martinez is a 23 y.o. female presenting for 1 year history of intermittent low back pain. Reports that last year she had a baby, received an epidural injection and states that since then her back has hurt intermittently around the area where she received the injection. Also, patient works 2 part time jobs, works 7 days a week, stands most of her shift. No heavy lifting at her jobs. Does not hydrate well. Drinks mostly juice. Has used APAP, muscle rub.   No current facility-administered medications for this encounter.   Current Outpatient Medications:  .  benzonatate (TESSALON) 200 MG capsule, Take 1 capsule (200 mg total) by mouth 3 (three) times daily as needed for cough., Disp: 30 capsule, Rfl: 0 .  chlorpheniramine-HYDROcodone (TUSSIONEX PENNKINETIC ER) 10-8 MG/5ML SUER, Take 5 mLs by mouth every 12 (twelve) hours as needed for cough., Disp: 60 mL, Rfl: 0 .  ibuprofen (ADVIL,MOTRIN) 600 MG tablet, Take 1 tablet (600 mg total) by mouth every 6 (six) hours as needed., Disp: 30 tablet, Rfl: 0 .  ondansetron (ZOFRAN ODT) 8 MG disintegrating tablet, 1/2- 1 tablet q 8 hr prn nausea, vomiting, Disp: 20 tablet, Rfl: 0 .  oseltamivir (TAMIFLU) 75 MG capsule, Take 1 capsule (75 mg total) by mouth 2 (two) times daily. X 5 days, Disp: 10 capsule, Rfl: 0    No Known Allergies   Past Medical History:  Diagnosis Date  . Medical history non-contributory   . Vaginal Pap smear, abnormal      Past Surgical History:  Procedure Laterality Date  . CESAREAN SECTION N/A 06/19/2017   Procedure: CESAREAN SECTION;  Surgeon: Osborne Oman, MD;  Location: [place];  Service: Obstetrics;  Laterality: N/A;  . NO PAST SURGERIES    . WISDOM TOOTH EXTRACTION      ROS  Objective:   Vitals: BP 121/65 (BP Location: Right Arm)   Pulse 77   Temp 98.8 F (37.1 C)   Resp 18   Wt 190 lb (86.2 kg)   LMP 08/24/2018   SpO2 100%   BMI 34.75 kg/m   Physical Exam Constitutional:      General: She is not in acute distress.    Appearance: Normal appearance. She is well-developed. She is not ill-appearing.  HENT:     Head: Normocephalic and atraumatic.     Nose: Nose normal.     Mouth/Throat:     Mouth: Mucous membranes are moist.     Pharynx: Oropharynx is clear.  Eyes:     General: No scleral icterus.    Extraocular Movements: Extraocular movements intact.     Pupils: Pupils are equal, round, and reactive to light.  Cardiovascular:     Rate and Rhythm: Normal rate.  Pulmonary:     Effort: Pulmonary effort is normal.  Musculoskeletal:     Lumbar back: She exhibits decreased range of motion (Extension and rotation) and tenderness (Over areas depicted). She exhibits no bony tenderness, no swelling, no edema, no deformity and no spasm.       Back:     Comments: Negative straight leg raise bilaterally.  Skin:    General: Skin is warm and dry.  Neurological:     General: No focal deficit present.     Mental Status: She is alert and oriented to person, place, and time.     Deep Tendon Reflexes: Reflexes normal.  Psychiatric:        Mood and Affect:  Mood normal.        Behavior: Behavior normal.        Thought Content: Thought content normal.        Judgment: Judgment normal.    Assessment and Plan :   1. Chronic bilateral low back pain without sciatica   2. Muscle spasm of back    We will manage conservatively for musculoskeletal type pain associated with her work and lack of hydration.  Counseled on use of NSAID, muscle relaxant and modification of physical activity.  Anticipatory guidance provided.  Counseled patient on potential for adverse effects with medications prescribed/recommended today, ER and return-to-clinic precautions discussed, patient verbalized understanding.     Wallis BambergMani, Nyeema Want, PA-C 09/29/18 1139

## 2019-02-12 IMAGING — US US MFM FETAL BPP W/O NON-STRESS
1 series · 15 of 22 positions shown · non-contrast
Comparison: none

[Series 1: us mfm fetal bpp w/o non-stress · 22 acquisitions, 15 frames shown]
[im 1/22]
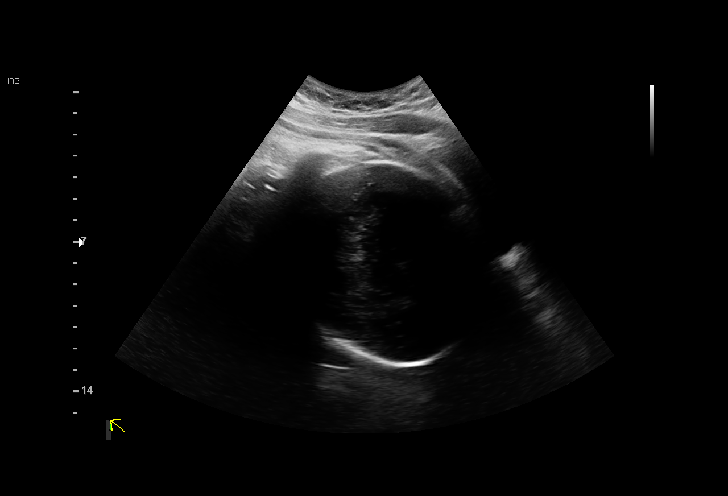
[im 3/22]
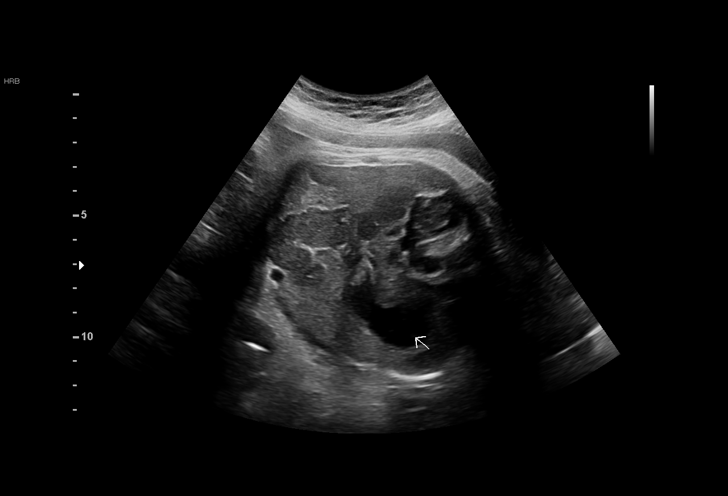
[im 4/22]
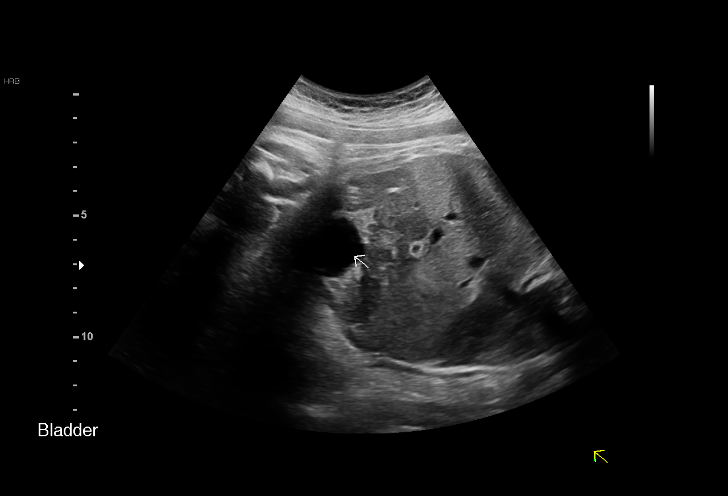
[im 6/22]
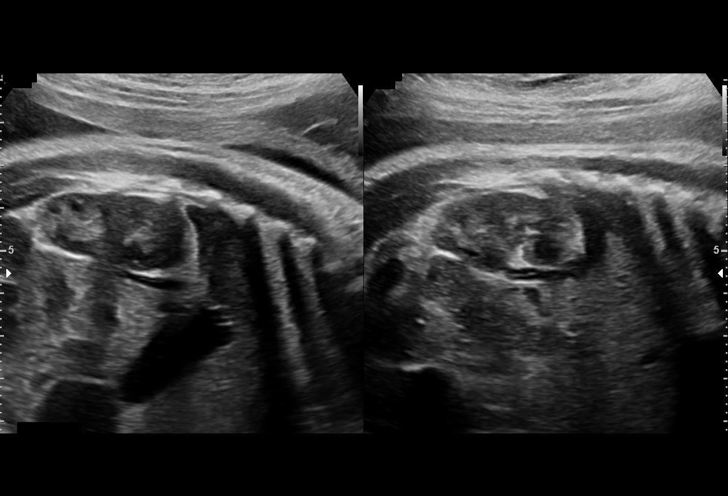
[im 7/22]
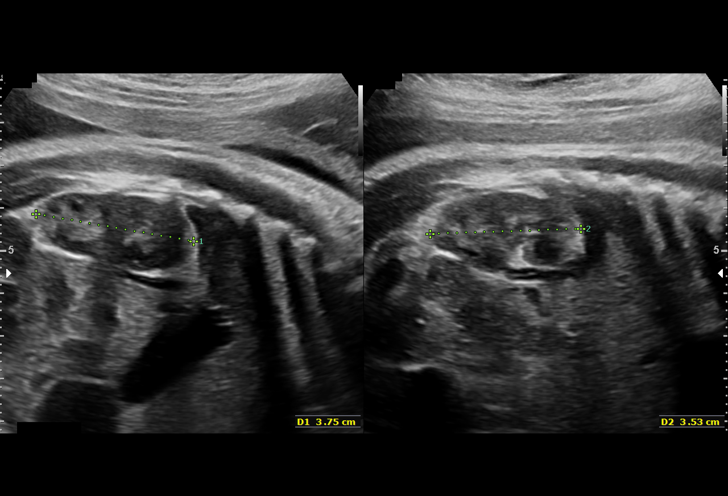
[im 9/22]
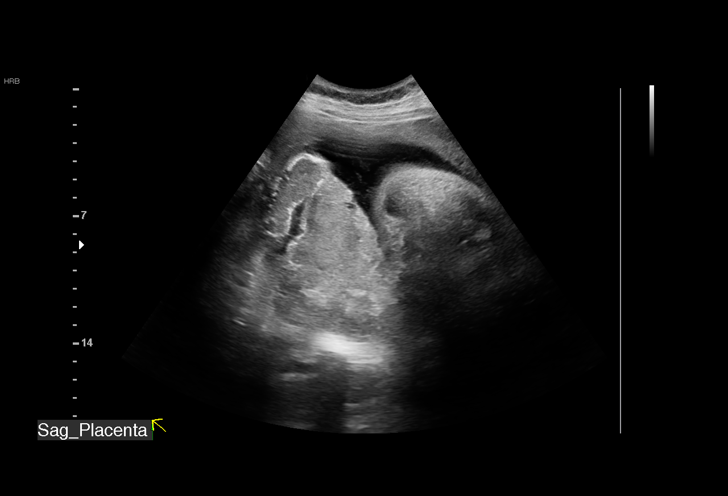
[im 10/22]
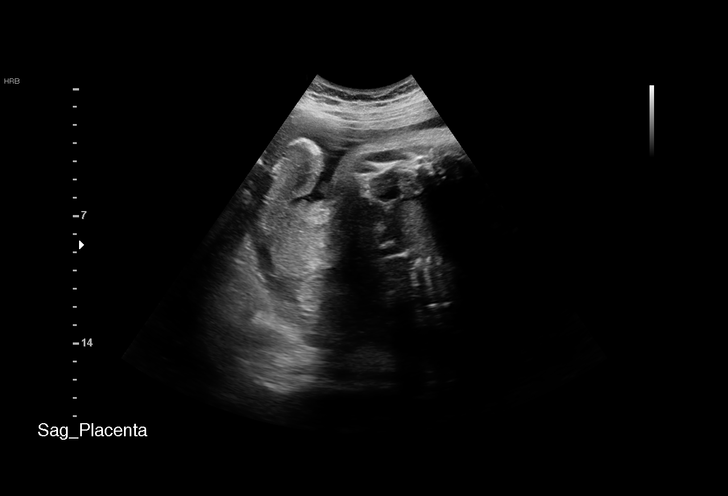
[im 12/22]
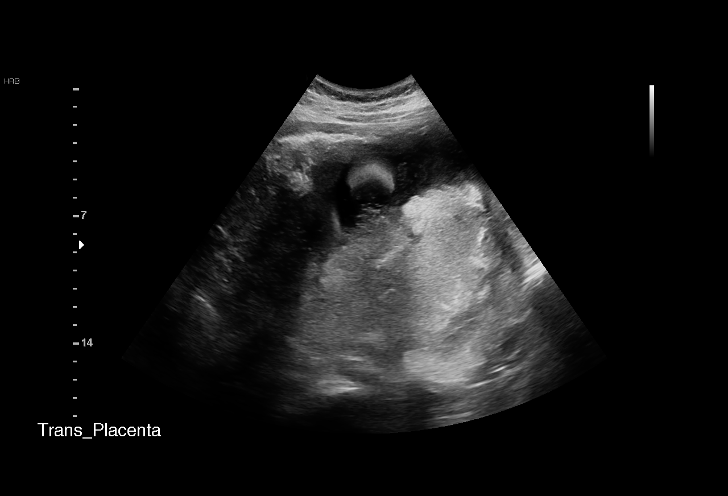
[im 13/22]
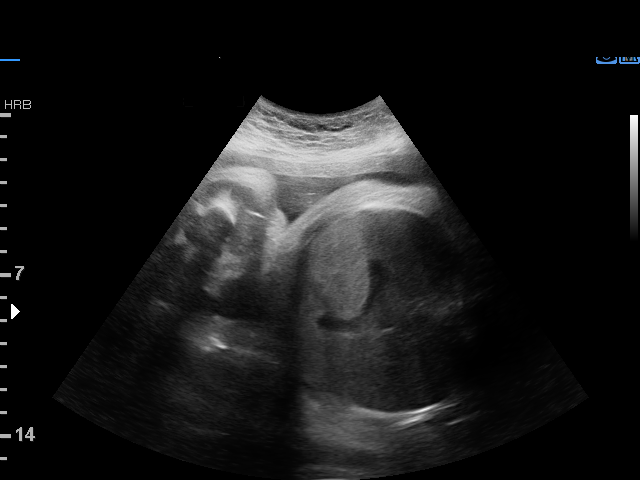
[im 14/22]
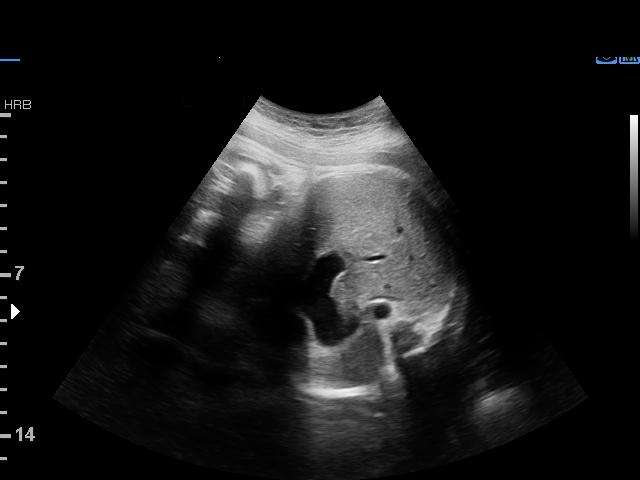
[im 16/22]
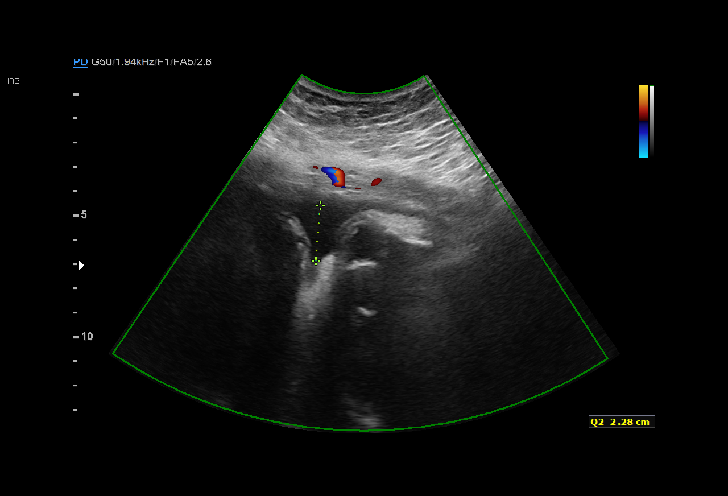
[im 17/22]
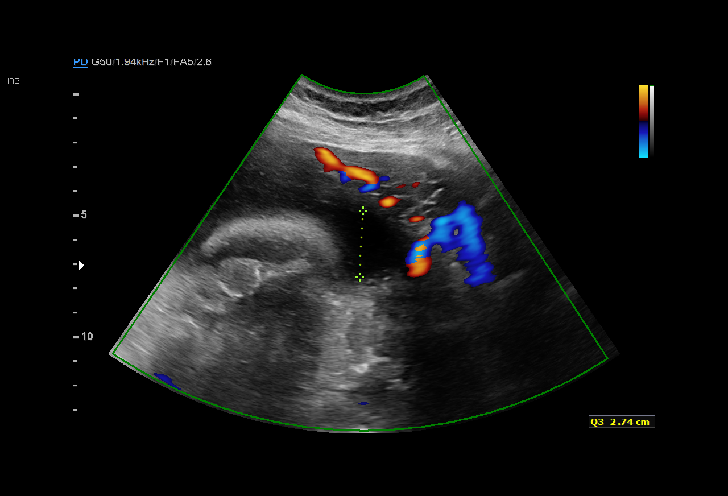
[im 19/22]
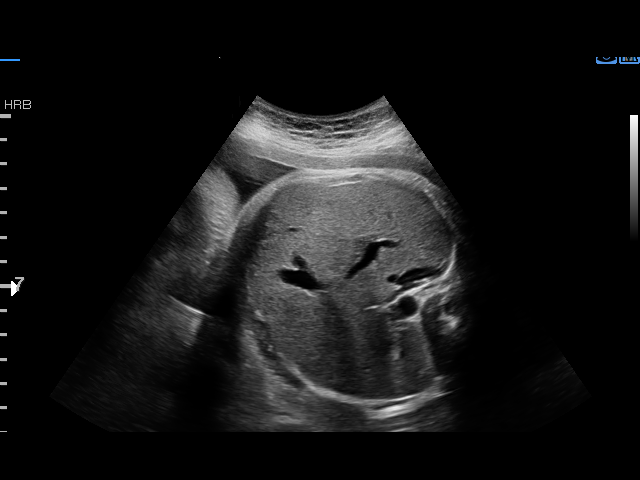
[im 20/22]
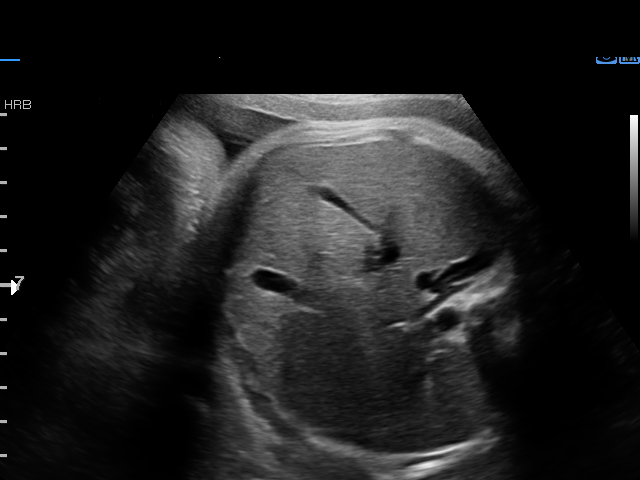
[im 22/22]
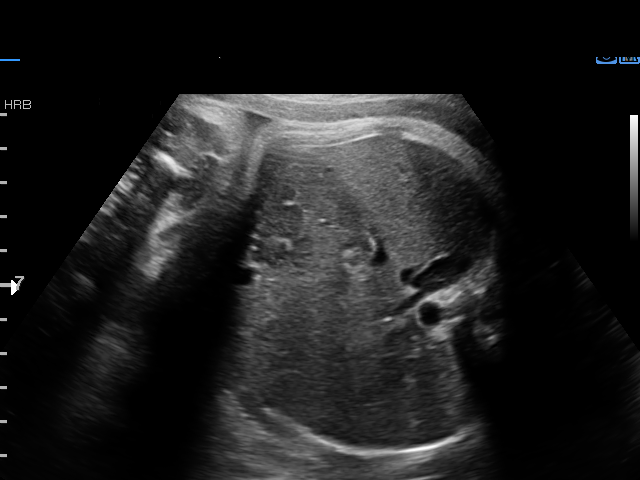

[15 of 22 positions shown; findings below may reference images not displayed]

[REDACTED]-
Faculty Physician

Indications

38 weeks gestation of pregnancy
Variable fetal heart rate decelerations,
antepartum

OB History

Gravidity:    1         Term:   0        Prem:   0        SAB:   0
TOP:          0       Ectopic:  0        Living: 0
Fetal Evaluation

Num Of Fetuses:     1
Fetal Heart         122
Rate(bpm):
Cardiac Activity:   Observed
Presentation:       Cephalic
Placenta:           Posterior, above cervical os

Amniotic Fluid
AFI FV:      Subjectively within normal limits

AFI Sum(cm)     %Tile       Largest Pocket(cm)
12.63           48

RUQ(cm)       RLQ(cm)       LUQ(cm)        LLQ(cm)
3.53
Biophysical Evaluation
Amniotic F.V:   Within normal limits       F. Tone:        Observed
F. Movement:    Observed                   Score:          [DATE]
F. Breathing:   Observed
Gestational Age

LMP:           39w 5d        Date:  09/10/16                 EDD:   06/17/17
Best:          38w 5d     Det. By:  U/S C R L  (12/12/16)    EDD:   06/24/17
Anatomy

Thoracic:              Appears normal         Kidneys:                Appear normal
Stomach:               Appears normal, left   Bladder:                Appears normal
sided
Cervix Uterus Adnexa

Cervix
Not visualized (advanced GA >40wks)
Impression

SIUP at 38+5 weeks with cardiac activity
Cephalic presentation
Normal amniotic fluid volume
BPP [DATE]
Recommendations

Follow-up as clinically indicated

## 2019-06-20 ENCOUNTER — Other Ambulatory Visit: Payer: Self-pay

## 2019-06-20 ENCOUNTER — Ambulatory Visit (HOSPITAL_COMMUNITY)
Admission: EM | Admit: 2019-06-20 | Discharge: 2019-06-20 | Disposition: A | Discharge status: Home / Self Care | Payer: BC Managed Care – PPO | Attending: Internal Medicine | Admitting: Internal Medicine

## 2019-06-20 ENCOUNTER — Encounter (HOSPITAL_COMMUNITY): Payer: Self-pay

## 2019-06-20 DIAGNOSIS — Z833 Family history of diabetes mellitus: Secondary | ICD-10-CM | POA: Diagnosis not present

## 2019-06-20 DIAGNOSIS — Z20822 Contact with and (suspected) exposure to covid-19: Secondary | ICD-10-CM | POA: Diagnosis not present

## 2019-06-20 DIAGNOSIS — J069 Acute upper respiratory infection, unspecified: Secondary | ICD-10-CM | POA: Diagnosis not present

## 2019-06-20 DIAGNOSIS — R05 Cough: Secondary | ICD-10-CM | POA: Diagnosis present

## 2019-06-20 DIAGNOSIS — Z87891 Personal history of nicotine dependence: Secondary | ICD-10-CM | POA: Insufficient documentation

## 2019-06-20 DIAGNOSIS — R439 Unspecified disturbances of smell and taste: Secondary | ICD-10-CM | POA: Diagnosis not present

## 2019-06-20 MED ORDER — SALINE SPRAY 0.65 % NA SOLN: 1.0000 | NASAL | 0 refills | Status: DC | PRN

## 2019-06-20 NOTE — ED Triage Notes (Signed)
Pt c/o productive cough w/yellow mucous, nasal congestion, loss of taste and smell since yesterday. Pt has non labored breathing. Skin color WNL. Lungs clear.

## 2019-06-21 LAB — SARS CORONAVIRUS 2 (TAT 6-24 HRS): SARS Coronavirus 2: NEGATIVE

## 2019-06-21 NOTE — ED Provider Notes (Signed)
[place]    CSN: [id number] Arrival date & time: 06/20/19  1515      History   Chief Complaint Chief Complaint  Patient presents with  . COVID symptoms    HPI Yolanda Martinez is a 24 y.o. female comes to urgent care with her 1 day history of cough with yellowish phlegm, nasal congestion, loss of taste and smell.  Patient denies any shortness of breath.  No generalized body aches.  No fever or chills.  No headaches.  No sick contacts.  Patient is requesting Covid testing.   HPI  Past Medical History:  Diagnosis Date  . Medical history non-contributory   . Vaginal Pap smear, abnormal     Patient Active Problem List   Diagnosis Date Noted  . Status post primary low transverse cesarean section 06/21/2017  . Labor and delivery, indication for care 06/18/2017    Past Surgical History:  Procedure Laterality Date  . CESAREAN SECTION N/A 06/19/2017   Procedure: CESAREAN SECTION;  Surgeon: Osborne Oman, MD;  Location: [place];  Service: Obstetrics;  Laterality: N/A;  . NO PAST SURGERIES    . WISDOM TOOTH EXTRACTION      OB History    Gravida  1   Para  1   Term  1   Preterm      AB      Living  1     SAB      TAB      Ectopic      Multiple  0   Live Births  1            Home Medications    Prior to Admission medications   Medication Sig Start Date End Date Taking? Authorizing Provider  sodium chloride (OCEAN) 0.65 % SOLN nasal spray Place 1 spray into both nostrils as needed for congestion. 06/20/19   Floriene Jeschke, Myrene Galas, MD    Family History Family History  Problem Relation Age of Onset  . Diabetes Mother   . Diabetes Father     Social History Social History   Tobacco Use  . Smoking status: Former Smoker    Types: Cigars  . Smokeless tobacco: Never Used  . Tobacco comment: quit prior to preg  Substance Use Topics  . Alcohol use: Yes    Comment: occ  . Drug use: Not Currently    Types: Marijuana    Comment:  Aug 2018     Allergies   Patient has no known allergies.   Review of Systems Review of Systems  Constitutional: Positive for fatigue. Negative for activity change, chills and fever.  HENT: Positive for congestion. Negative for postnasal drip, sinus pressure, sinus pain and sore throat.   Respiratory: Negative.   Cardiovascular: Negative.   Gastrointestinal: Negative for abdominal pain, diarrhea, nausea and vomiting.  Genitourinary: Negative.   Musculoskeletal: Negative.   Neurological: Negative.      Physical Exam Triage Vital Signs ED Triage Vitals  Enc Vitals Group     BP 06/20/19 1536 131/78     Pulse Rate 06/20/19 1536 (!) 105     Resp 06/20/19 1536 18     Temp 06/20/19 1536 98.2 F (36.8 C)     Temp Source 06/20/19 1536 Oral     SpO2 06/20/19 1536 100 %     Weight 06/20/19 1537 208 lb (94.3 kg)     Height 06/20/19 1537 5\' 2"  (1.575 m)     Head Circumference --  Peak Flow --      Pain Score 06/20/19 1536 0     Pain Loc --      Pain Edu? --      Excl. in GC? --    No data found.  Updated Vital Signs BP 131/78   Pulse (!) 105   Temp 98.2 F (36.8 C) (Oral)   Resp 18   Ht 5\' 2"  (1.575 m)   Wt 94.3 kg   SpO2 100%   BMI 38.04 kg/m   Visual Acuity Right Eye Distance:   Left Eye Distance:   Bilateral Distance:    Right Eye Near:   Left Eye Near:    Bilateral Near:     Physical Exam Vitals and nursing note reviewed.  Constitutional:      General: She is not in acute distress.    Appearance: Normal appearance. She is not ill-appearing.  HENT:     Right Ear: Tympanic membrane normal.     Left Ear: Tympanic membrane normal.     Nose: No rhinorrhea.     Mouth/Throat:     Pharynx: No posterior oropharyngeal erythema.  Eyes:     Pupils: Pupils are equal, round, and reactive to light.  Cardiovascular:     Rate and Rhythm: Normal rate and regular rhythm.     Pulses: Normal pulses.     Heart sounds: Normal heart sounds.  Pulmonary:     Effort:  Pulmonary effort is normal.     Breath sounds: Normal breath sounds. No wheezing or rhonchi.  Abdominal:     General: Bowel sounds are normal. There is no distension.     Palpations: Abdomen is soft.     Tenderness: There is no abdominal tenderness.     Hernia: No hernia is present.  Skin:    Capillary Refill: Capillary refill takes less than 2 seconds.  Neurological:     General: No focal deficit present.     Mental Status: She is alert and oriented to person, place, and time.      UC Treatments / Results  Labs (all labs ordered are listed, but only abnormal results are displayed) Labs Reviewed  SARS CORONAVIRUS 2 (TAT 6-24 HRS)    EKG   Radiology No results found.  Procedures Procedures (including critical care time)  Medications Ordered in UC Medications - No data to display  Initial Impression / Assessment and Plan / UC Course  I have reviewed the triage vital signs and the nursing notes.  Pertinent labs & imaging results that were available during my care of the patient were reviewed by me and considered in my medical decision making (see chart for details).     1.  Viral URI: COVID-19 testing Patient is advised to quarantine until COVID-19 test results available Continue using [place] nasal spray. Return precautions given If Covid 19 results are positive we will call the patient with further guidance. Final Clinical Impressions(s) / UC Diagnoses   Final diagnoses:  Viral URI   Discharge Instructions   None    ED Prescriptions    Medication Sig Dispense Auth. Provider   sodium chloride (OCEAN) 0.65 % SOLN nasal spray Place 1 spray into both nostrils as needed for congestion.  Laiya Wisby, , MD     PDMP not reviewed this encounter.   Britta Mccreedy, MD 06/21/19 2051

## 2019-09-06 ENCOUNTER — Other Ambulatory Visit: Payer: Self-pay

## 2019-09-06 ENCOUNTER — Encounter (INDEPENDENT_AMBULATORY_CARE_PROVIDER_SITE_OTHER): Payer: Self-pay | Admitting: Primary Care

## 2019-09-06 ENCOUNTER — Ambulatory Visit (INDEPENDENT_AMBULATORY_CARE_PROVIDER_SITE_OTHER): Payer: BC Managed Care – PPO | Admitting: Primary Care

## 2019-09-06 VITALS — BP 118/73 | HR 98 | Ht 63.5 in | Wt 215.0 lb

## 2019-09-06 DIAGNOSIS — Z7689 Persons encountering health services in other specified circumstances: Secondary | ICD-10-CM

## 2019-09-06 DIAGNOSIS — S0300XA Dislocation of jaw, unspecified side, initial encounter: Secondary | ICD-10-CM | POA: Diagnosis not present

## 2019-09-06 DIAGNOSIS — Z6837 Body mass index (BMI) 37.0-37.9, adult: Secondary | ICD-10-CM

## 2019-09-06 DIAGNOSIS — M545 Low back pain, unspecified: Secondary | ICD-10-CM

## 2019-09-06 DIAGNOSIS — E6609 Other obesity due to excess calories: Secondary | ICD-10-CM | POA: Diagnosis not present

## 2019-09-06 MED ORDER — IBUPROFEN 600 MG PO TABS: 600.0000 mg | ORAL_TABLET | Freq: Three times a day (TID) | ORAL | 1 refills | Status: DC | PRN

## 2019-09-06 NOTE — Patient Instructions (Addendum)
Jaw Dislocation  A jaw dislocation is when the joint that connects the jaw bones (temporomandibular joint) moves out of place (gets dislocated). This can be very painful. Your jaw must be moved back into place by your doctor (manual reduction). What are the causes? Common causes of this condition include:  A hard, direct hit or injury (trauma) to the jaw.  Opening the mouth too wide. This can be caused by: ? Eating. ? Yawning. ? Throwing up (vomiting). ? Having a dental procedure. ? Receiving medicine by mouth to make you fall asleep (general anesthetic). What increases the risk? You are more likely to get this condition if:  Your jaw has moved out of place in the past.  You play contact sports.  You have a jaw that is loose or can move beyond the normal range. What are the signs or symptoms? Symptoms of this condition may include:  Very bad pain.  Not being able to move your jaw or close your mouth all the way.  Feeling that your teeth do not line up as they normally do when you bite.  Drooling.  Trouble speaking or swallowing. How is this treated? The most common treatment for this condition is to have your doctor move your jaw back into place. Your doctor may then wrap a bandage around your head and jaw. This will help to hold your jaw in place while it heals. Your doctor may also recommend:  Medicines to reduce pain and swelling.  Medicines to relax your muscles.  A neck brace to support your jaw. Follow these instructions at home: Managing pain, stiffness, and swelling   If told, put ice on the injured area: ? Put ice in a plastic bag. ? Place a towel between your skin and the bag. ? Leave the ice on for 20 minutes, 2-3 times a day. If you have a neck brace or jaw bandage:  Wear the brace or bandage as told by your doctor. Remove it only as told by your doctor.  Keep the brace or bandage clean.  If the brace or bandage is not waterproof: ? Do not let it  get wet. ? Cover it with a watertight covering when you take a bath or shower. Eating and drinking  Follow instructions from your doctor about what you can eat and drink while your jaw is healing. You may be asked to: ? Eat soft foods. ? Eat liquid food. ? Eat food that has been cut into small pieces. Activity  Do not open your mouth wide until your doctor says that you can.  Rest your jaw as told by your doctor.  Avoid activities that are like the one that caused your injury.  When you sneeze or yawn, put a hand under your jaw. This will keep your mouth from opening too wide. General instructions  Take over-the-counter and prescription medicines only as told by your doctor.  Do not take baths, swim, or use a hot tub until your doctor approves. Ask your doctor if you may take showers. You may only be allowed to take sponge baths.  Keep all follow-up visits as told by your doctor. This is important. Contact a doctor if:  You have pain that gets worse, and medicines do not help your pain. Get help right away if:  Your jaw moves out of place again.  You have trouble breathing. Summary  A jaw dislocation is when the joint that connects the jaw bones (temporomandibular joint) moves out of place.    Jaw dislocation can be very painful.  The most common treatment for this condition is for your jaw to be moved back into place by your doctor (manual reduction). This information is not intended to replace advice given to you by your health care provider. Make sure you discuss any questions you have with your health care provider. Document Revised: 12/02/2017 Document Reviewed: 12/02/2017 Elsevier Patient Education  2020 Elsevier Inc.  

## 2019-09-06 NOTE — Progress Notes (Signed)
New Patient Office Visit  Subjective:  Patient ID: Yolanda Martinez, female    DOB: 1995/04/01  Age: 24 y.o. MRN: [id number]  CC:  Chief Complaint  Patient presents with   Establish Care    Pt. is here to establish care. Pt. have back pain and left ear pain.     HPI Ms.Yolanda Martinez is a 24 year old female who presents for establishment of care.  She also voices concerns about low back pain she rates her pain 9/10.  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  She is also concerned with left ear pain but she denies any loss of hearing.  Past Medical History:  Diagnosis Date   Medical history non-contributory    Vaginal Pap smear, abnormal     Past Surgical History:  Procedure Laterality Date   CESAREAN SECTION N/A 06/19/2017   Procedure: CESAREAN SECTION;  Surgeon: Osborne Oman, MD;  Location: [facility];  Service: Obstetrics;  Laterality: N/A;   NO PAST SURGERIES     WISDOM TOOTH EXTRACTION      Family History  Problem Relation Age of Onset   Diabetes Mother    Diabetes Father     Social History   Socioeconomic History   Marital status: Single    Spouse name: Not on file   Number of children: Not on file   Years of education: Not on file   Highest education level: Not on file  Occupational History   Not on file  Tobacco Use   Smoking status: Former Smoker    Types: Cigars   Smokeless tobacco: Never Used   Tobacco comment: quit prior to preg  Substance and Sexual Activity   Alcohol use: Yes    Comment: occ   Drug use: Yes    Types: Marijuana    Comment: Aug 2018   Sexual activity: Yes    Birth control/protection: None, Implant  Other Topics Concern   Not on file  Social History Narrative   Not on file   Social Determinants of Health   Financial Resource Strain:    Difficulty of Paying Living Expenses:   Food Insecurity:    Worried About  Charity fundraiser in the Last Year:    Arboriculturist in the Last Year:   Transportation Needs:    Film/video editor (Medical):    Lack of Transportation (Non-Medical):   Physical Activity:    Days of Exercise per Week:    Minutes of Exercise per Session:   Stress:    Feeling of Stress :   Social Connections:    Frequency of Communication with Friends and Family:    Frequency of Social Gatherings with Friends and Family:    Attends Religious Services:    Active Member of Clubs or Organizations:    Attends Music therapist:    Marital Status:   Intimate Partner Violence:    Fear of Current or Ex-Partner:    Emotionally Abused:    Physically Abused:    Sexually Abused:     ROS Review of Systems  HENT: Positive for ear pain.   Musculoskeletal: Positive for back pain.  All other systems reviewed and are negative.   Objective:   Today's Vitals: Physical Exam  Vitals:   09/06/19 1554  BP: 118/73  Pulse: 98  SpO2: 100%  Weight: 215 lb (97.5 kg)  Height: 5' 3.5" (  1.613 m)   General: Vital signs reviewed.  Patient is well-developed and well-nourished, Body mass index is 37.49 kg/m. obese female in no acute distress and cooperative with exam.  Head: Normocephalic and atraumatic. Eyes: EOMI, conjunctivae normal, no scleral icterus.  Neck: Supple, trachea midline, normal ROM, no JVD, masses, thyromegaly, or carotid bruit present.  Cardiovascular: RRR, S1 normal, S2 normal, no murmurs, gallops, or rubs. Pulmonary/Chest: Clear to auscultation bilaterally, no wheezes, rales, or rhonchi. Abdominal: Soft, non-tender, non-distended, BS +, no masses, organomegaly, or guarding present.  Musculoskeletal: No joint deformities, erythema, or stiffness, ROM full and nontender. Extremities: No lower extremity edema bilaterally,  pulses symmetric and intact bilaterally. No cyanosis or clubbing. Neurological: A&O x3, Strength is normal and symmetric  bilaterally, cranial nerve II-XII are grossly intact, no focal motor deficit, sensory intact to light touch bilaterally.  Skin: Warm, dry and intact. No rashes or erythema. Psychiatric: Normal mood and affect. speech and behavior is normal. Cognition and memory are normal.  Assessment & Plan:  Yolanda Martinez was seen today for establish care.  Diagnoses and all orders for this visit:  Dislocation of temporomandibular joint, initial encounter  Encounter to establish care Juluis Mire, NP-C will be your  (PCP) she is mastered prepared . Able to diagnosed and treatment also  answer health concern as well as continuing care of varied medical conditions, not limited by cause, organ system, or diagnosis.  -     CBC with Differential/Platelet; Future -     CMP14+EGFR; Future  Acute bilateral low back pain without sciatic Work on losing weight to help reduce joint pain. May alternate with heat and ice application for pain relief. May also alternate with acetaminophen and Ibuprofen as prescribed pain relief. Other alternatives include massage, acupuncture and water aerobics.  You must stay active and avoid a sedentary lifestyle. -     ibuprofen (ADVIL) 600 MG tablet; Take 1 tablet (600 mg total) by mouth every 8 (eight) hours as needed.  Class 2 obesity due to excess calories without serious comorbidity with body mass index (BMI) of 37.0 to 37.9 in adult Obesity is 30-39 indicating an excess in caloric intake or underlining conditions. This may lead to other co-morbidities.  Examples are hypertension, type 2 diabetes and respiratory complications.  Lifestyle modifications of diet and exercise may reduce obesity.  -     Lipid panel; Future -     TSH + free T4; Future    Outpatient Encounter Medications as of 09/06/2019  Medication Sig   ibuprofen (ADVIL) 600 MG tablet Take 1 tablet (600 mg total) by mouth every 8 (eight) hours as needed.   sodium chloride (OCEAN) 0.65 % SOLN nasal spray Place 1  spray into both nostrils as needed for congestion. (Patient not taking: Reported on 09/06/2019)   No facility-administered encounter medications on file as of 09/06/2019.    Follow-up: No follow-ups on file.   Kerin Perna, NP

## 2019-09-09 ENCOUNTER — Other Ambulatory Visit: Payer: Self-pay

## 2019-09-09 ENCOUNTER — Other Ambulatory Visit (INDEPENDENT_AMBULATORY_CARE_PROVIDER_SITE_OTHER): Payer: BC Managed Care – PPO

## 2019-09-09 DIAGNOSIS — E6609 Other obesity due to excess calories: Secondary | ICD-10-CM

## 2019-09-09 DIAGNOSIS — Z7689 Persons encountering health services in other specified circumstances: Secondary | ICD-10-CM

## 2019-09-09 DIAGNOSIS — Z6837 Body mass index (BMI) 37.0-37.9, adult: Secondary | ICD-10-CM

## 2019-09-10 LAB — LIPID PANEL
Chol/HDL Ratio: 4.4 ratio (ref 0.0–4.4)
Cholesterol, Total: 219 mg/dL — ABNORMAL HIGH (ref 100–199)
HDL: 50 mg/dL (ref >39)
LDL Chol Calc (NIH): 155 mg/dL — ABNORMAL HIGH (ref 0–99)
Triglycerides: 80 mg/dL (ref 0–149)
VLDL Cholesterol Cal: 14 mg/dL (ref 5–40)

## 2019-09-10 LAB — CMP14+EGFR
ALT: 25 IU/L (ref 0–32)
AST: 19 IU/L (ref 0–40)
Albumin/Globulin Ratio: 1.3 (ref 1.2–2.2)
Albumin: 4 g/dL (ref 3.9–5.0)
Alkaline Phosphatase: 79 IU/L (ref 48–121)
BUN/Creatinine Ratio: 23 (ref 9–23)
BUN: 15 mg/dL (ref 6–20)
Bilirubin Total: 0.3 mg/dL (ref 0.0–1.2)
CO2: 19 mmol/L — ABNORMAL LOW (ref 20–29)
Calcium: 9.3 mg/dL (ref 8.7–10.2)
Chloride: 106 mmol/L (ref 96–106)
Creatinine, Ser: 0.64 mg/dL (ref 0.57–1.00)
GFR calc Af Amer: 145 mL/min/{1.73_m2} (ref >59)
GFR calc non Af Amer: 126 mL/min/{1.73_m2} (ref >59)
Globulin, Total: 3 g/dL (ref 1.5–4.5)
Glucose: 130 mg/dL — ABNORMAL HIGH (ref 65–99)
Potassium: 4.2 mmol/L (ref 3.5–5.2)
Sodium: 138 mmol/L (ref 134–144)
Total Protein: 7 g/dL (ref 6.0–8.5)

## 2019-09-10 LAB — CBC WITH DIFFERENTIAL/PLATELET
Basophils Absolute: 0 10*3/uL (ref 0.0–0.2)
Basos: 0 %
EOS (ABSOLUTE): 0.1 10*3/uL (ref 0.0–0.4)
Eos: 1 %
Hematocrit: 40.1 % (ref 34.0–46.6)
Hemoglobin: 13.1 g/dL (ref 11.1–15.9)
Immature Grans (Abs): 0 10*3/uL (ref 0.0–0.1)
Immature Granulocytes: 0 %
Lymphocytes Absolute: 2.9 10*3/uL (ref 0.7–3.1)
Lymphs: 36 %
MCH: 29 pg (ref 26.6–33.0)
MCHC: 32.7 g/dL (ref 31.5–35.7)
MCV: 89 fL (ref 79–97)
Monocytes Absolute: 0.5 10*3/uL (ref 0.1–0.9)
Monocytes: 6 %
Neutrophils Absolute: 4.7 10*3/uL (ref 1.4–7.0)
Neutrophils: 57 %
Platelets: 274 10*3/uL (ref 150–450)
RBC: 4.51 x10E6/uL (ref 3.77–5.28)
RDW: 13.3 % (ref 11.7–15.4)
WBC: 8.2 10*3/uL (ref 3.4–10.8)

## 2019-09-10 LAB — TSH+FREE T4
Free T4: 1.07 ng/dL (ref 0.82–1.77)
TSH: 1.42 u[IU]/mL (ref 0.450–4.500)

## 2019-09-12 ENCOUNTER — Other Ambulatory Visit (INDEPENDENT_AMBULATORY_CARE_PROVIDER_SITE_OTHER): Payer: Self-pay | Admitting: Primary Care

## 2019-09-12 DIAGNOSIS — E782 Mixed hyperlipidemia: Secondary | ICD-10-CM

## 2019-09-12 MED ORDER — ROSUVASTATIN CALCIUM 20 MG PO TABS
20.0000 mg | ORAL_TABLET | Freq: Every day | ORAL | 1 refills | Status: DC
Start: 2019-09-12 — End: 2019-09-23

## 2019-09-19 ENCOUNTER — Ambulatory Visit: Payer: Self-pay

## 2019-09-19 NOTE — Telephone Encounter (Signed)
Pt. Reports she started taking Crestor last week. States since she started the medication she has had chest pain that comes and goes. Last 2 minutes, middle of her chest. Does not radiate. No other symptoms. Pain is sharp and 4/10 on pain scale. Feels this is a side effect of the Crestor. Pt. Is currently at work. No chest pain now. Instructed if symptoms return, go to ED. Wants to [facility] what her PCP thinks. Please advise pt.  Answer Assessment - Initial Assessment Questions 1. LOCATION: "Where does it hurt?"       Middle 2. RADIATION: "Does the pain go anywhere else?" (e.g., into neck, jaw, arms, back)     No 3. ONSET: "When did the chest pain begin?" (Minutes, hours or days)      Last week 4. PATTERN "Does the pain come and go, or has it been constant since it started?"  "Does it get worse with exertion?"      Comes and goes 5. DURATION: "How long does it last" (e.g., seconds, minutes, hours)     2 minutes 6. SEVERITY: "How bad is the pain?"  (e.g., Scale 1-10; mild, moderate, or severe)    - MILD (1-3): doesn't interfere with normal activities     - MODERATE (4-7): interferes with normal activities or awakens from sleep    - SEVERE (8-10): excruciating pain, unable to do any normal activities       4 7. CARDIAC RISK FACTORS: "Do you have any history of heart problems or risk factors for heart disease?" (e.g., angina, prior heart attack; diabetes, high blood pressure, high cholesterol, smoker, or strong family history of heart disease)     No 8. PULMONARY RISK FACTORS: "Do you have any history of lung disease?"  (e.g., blood clots in lung, asthma, emphysema, birth control pills)     No 9. CAUSE: "What do you think is causing the chest pain?"     Maybe Crestor 10. OTHER SYMPTOMS: "Do you have any other symptoms?" (e.g., dizziness, nausea, vomiting, sweating, fever, difficulty breathing, cough)       No 11. PREGNANCY: "Is there any chance you are pregnant?" "When was your last menstrual  period?"       No  Protocols used: CHEST PAIN-A-AH

## 2019-09-19 NOTE — Telephone Encounter (Signed)
Sent to PCP to advise 

## 2019-09-22 ENCOUNTER — Telehealth (INDEPENDENT_AMBULATORY_CARE_PROVIDER_SITE_OTHER): Payer: Self-pay | Admitting: Primary Care

## 2019-09-23 ENCOUNTER — Encounter (INDEPENDENT_AMBULATORY_CARE_PROVIDER_SITE_OTHER): Payer: Self-pay | Admitting: Primary Care

## 2019-09-23 ENCOUNTER — Other Ambulatory Visit (INDEPENDENT_AMBULATORY_CARE_PROVIDER_SITE_OTHER): Payer: Self-pay | Admitting: Primary Care

## 2019-09-23 MED ORDER — PRAVASTATIN SODIUM 20 MG PO TABS: 20.0000 mg | ORAL_TABLET | Freq: Every day | ORAL | 3 refills | Status: DC

## 2019-09-23 NOTE — Telephone Encounter (Signed)
Did you attempt to reach patient in regards to crestor. If so please call her back.

## 2019-09-23 NOTE — Telephone Encounter (Signed)
Copied from CRM [id number]. Topic: General - Call Back - No Documentation >> Sep 22, 2019  5:15 PM Mcneil, Ja-Kwan wrote: Reason for CRM: Pt stated she just had a missed call so she was just returning the call

## 2020-02-19 ENCOUNTER — Ambulatory Visit (HOSPITAL_COMMUNITY)
Admission: EM | Admit: 2020-02-19 | Discharge: 2020-02-19 | Disposition: A | Discharge status: Home / Self Care | Payer: BC Managed Care – PPO | Attending: Student | Admitting: Student

## 2020-02-19 ENCOUNTER — Encounter (HOSPITAL_COMMUNITY): Payer: Self-pay

## 2020-02-19 ENCOUNTER — Other Ambulatory Visit: Payer: Self-pay

## 2020-02-19 DIAGNOSIS — Z20822 Contact with and (suspected) exposure to covid-19: Secondary | ICD-10-CM

## 2020-02-19 LAB — RESP PANEL BY RT-PCR (FLU A&B, COVID) ARPGX2
Influenza A by PCR: NEGATIVE
Influenza B by PCR: NEGATIVE
SARS Coronavirus 2 by RT PCR: POSITIVE — AB

## 2020-02-19 MED ORDER — PROMETHAZINE-DM 6.25-15 MG/5ML PO SYRP: 5.0000 mL | ORAL_SOLUTION | Freq: Four times a day (QID) | ORAL | 0 refills | Status: DC | PRN

## 2020-02-19 MED ORDER — BENZONATATE 100 MG PO CAPS: 100.0000 mg | ORAL_CAPSULE | Freq: Three times a day (TID) | ORAL | 0 refills | Status: DC

## 2020-02-19 NOTE — ED Provider Notes (Signed)
[facility]    CSN: [id number] Arrival date & time: 02/19/20  1016      History   Chief Complaint Chief Complaint  Patient presents with  . Sore Throat  . Nasal Congestion    HPI Yolanda Martinez is a 25 y.o. female Presenting for URI symptoms for 1 days following covid exposure 3 days ago. Endorses sore throat, body aches, congestion, cough. States symptoms are mild but wants to get checked out. Denies fevers/chills, n/v/d, shortness of breath, chest pain, facial pain, teeth pain, headaches,  loss of taste/smell, swollen lymph nodes, ear pain.  Denies chest pain, shortness of breath, confusion, high fevers.  Fully vaccinated for covid-19.   HPI  Past Medical History:  Diagnosis Date  . Medical history non-contributory   . Vaginal Pap smear, abnormal     Patient Active Problem List   Diagnosis Date Noted  . Status post primary low transverse cesarean section 06/21/2017  . Labor and delivery, indication for care 06/18/2017    Past Surgical History:  Procedure Laterality Date  . CESAREAN SECTION N/A 06/19/2017   Procedure: CESAREAN SECTION;  Surgeon: Tereso Newcomer, MD;  Location: [facility];  Service: Obstetrics;  Laterality: N/A;  . NO PAST SURGERIES    . WISDOM TOOTH EXTRACTION      OB History    Gravida  1   Para  1   Term  1   Preterm      AB      Living  1     SAB      IAB      Ectopic      Multiple  0   Live Births  1            Home Medications    Prior to Admission medications   Medication Sig Start Date End Date Taking? Authorizing Provider  benzonatate (TESSALON) 100 MG capsule Take 1 capsule (100 mg total) by mouth every 8 (eight) hours. 02/19/20  Yes Rhys Martini, PA-C  promethazine-dextromethorphan (PROMETHAZINE-DM) 6.25-15 MG/5ML syrup Take 5 mLs by mouth 4 (four) times daily as needed for cough. 02/19/20  Yes Rhys Martini, PA-C  ibuprofen (ADVIL) 600 MG tablet Take 1 tablet (600 mg total) by mouth every  8 (eight) hours as needed. 09/06/19   Grayce Sessions, NP  pravastatin (PRAVACHOL) 20 MG tablet Take 1 tablet (20 mg total) by mouth daily. 09/23/19   Grayce Sessions, NP  sodium chloride (OCEAN) 0.65 % SOLN nasal spray Place 1 spray into both nostrils as needed for congestion. Patient not taking: Reported on 09/06/2019 06/20/19 02/19/20  Merrilee Jansky, MD    Family History Family History  Problem Relation Age of Onset  . Diabetes Mother   . Diabetes Father     Social History Social History   Tobacco Use  . Smoking status: Former Smoker    Types: Cigars  . Smokeless tobacco: Never Used  . Tobacco comment: quit prior to preg  Substance Use Topics  . Alcohol use: Yes    Comment: occ  . Drug use: Yes    Types: Marijuana    Comment: Aug 2018     Allergies   Patient has no known allergies.   Review of Systems Review of Systems  Constitutional: Negative for appetite change, chills and fever.  HENT: Positive for congestion and sore throat. Negative for ear pain, rhinorrhea, sinus pressure and sinus pain.   Eyes: Negative for redness and visual  disturbance.  Respiratory: Positive for cough. Negative for chest tightness, shortness of breath and wheezing.   Cardiovascular: Negative for chest pain and palpitations.  Gastrointestinal: Negative for abdominal pain, constipation, diarrhea, nausea and vomiting.  Genitourinary: Negative for dysuria, frequency and urgency.  Musculoskeletal: Negative for myalgias.  Neurological: Negative for dizziness, weakness and headaches.  Psychiatric/Behavioral: Negative for confusion.  All other systems reviewed and are negative.    Physical Exam Triage Vital Signs ED Triage Vitals  Enc Vitals Group     BP 02/19/20 1113 134/80     Pulse Rate 02/19/20 1113 93     Resp 02/19/20 1113 18     Temp 02/19/20 1113 98.2 F (36.8 C)     Temp Source 02/19/20 1113 Oral     SpO2 02/19/20 1113 99 %     Weight --      Height --      Head  Circumference --      Peak Flow --      Pain Score 02/19/20 1112 4     Pain Loc --      Pain Edu? --      Excl. in GC? --    No data found.  Updated Vital Signs BP 134/80 (BP Location: Left Arm)   Pulse 93   Temp 98.2 F (36.8 C) (Oral)   Resp 18   LMP  (LMP Unknown)   SpO2 99%   Breastfeeding No   Visual Acuity Right Eye Distance:   Left Eye Distance:   Bilateral Distance:    Right Eye Near:   Left Eye Near:    Bilateral Near:     Physical Exam Vitals reviewed.  Constitutional:      General: She is not in acute distress.    Appearance: Normal appearance. She is not ill-appearing.  HENT:     Head: Normocephalic and atraumatic.     Right Ear: Hearing, tympanic membrane, ear canal and external ear normal. No swelling or tenderness. There is no impacted cerumen. No mastoid tenderness. Tympanic membrane is not perforated, erythematous, retracted or bulging.     Left Ear: Hearing, tympanic membrane, ear canal and external ear normal. No swelling or tenderness. There is no impacted cerumen. No mastoid tenderness. Tympanic membrane is not perforated, erythematous, retracted or bulging.     Nose:     Right Sinus: No maxillary sinus tenderness or frontal sinus tenderness.     Left Sinus: No maxillary sinus tenderness or frontal sinus tenderness.     Mouth/Throat:     Mouth: Mucous membranes are moist.     Pharynx: Uvula midline. No oropharyngeal exudate or posterior oropharyngeal erythema.     Tonsils: No tonsillar exudate.  Cardiovascular:     Rate and Rhythm: Normal rate and regular rhythm.     Heart sounds: Normal heart sounds.  Pulmonary:     Breath sounds: Normal breath sounds and air entry. No wheezing, rhonchi or rales.  Chest:     Chest wall: No tenderness.  Abdominal:     General: Abdomen is flat. Bowel sounds are normal.     Tenderness: There is no abdominal tenderness. There is no guarding or rebound.  Lymphadenopathy:     Cervical: No cervical adenopathy.   Neurological:     General: No focal deficit present.     Mental Status: She is alert and oriented to person, place, and time.  Psychiatric:        Attention and Perception: Attention and perception normal.  Mood and Affect: Mood and affect normal.        Behavior: Behavior normal. Behavior is cooperative.        Thought Content: Thought content normal.        Judgment: Judgment normal.      UC Treatments / Results  Labs (all labs ordered are listed, but only abnormal results are displayed) Labs Reviewed - No data to display  EKG   Radiology No results found.  Procedures Procedures (including critical care time)  Medications Ordered in UC Medications - No data to display  Initial Impression / Assessment and Plan / UC Course  I have reviewed the triage vital signs and the nursing notes.  Pertinent labs & imaging results that were available during my care of the patient were reviewed by me and considered in my medical decision making (see chart for details).     centor score 0, rapid strep deferred  Covid and influenza tests sent today. Patient is fully vaccinated for covid-19. Isolation precautions per CDC guidelines until negative result. Symptomatic relief with OTC Mucinex, Nyquil, etc. Return precautions- new/worsening fevers/chills, shortness of breath, chest pain, abd pain, etc.   -Tessalon as needed for cough. Take one pill up to 3x daily (every 8 hours) -Promethazine DM cough syrup for congestion/cough. This could make you drowsy, so take at night before bed.  We are currently awaiting result of your PCR covid-19 test. Please isolate at home while awaiting these results. If your test is positive for Covid-19, continue to isolate at home for a total of 10 days from symptom onset. Treat your symptoms at home with OTC remedies like tylenol/ibuprofen, mucinex, nyquil, etc. Seek medical attention if you develop high fevers, chest pain, shortness of breath, ear  pain, facial pain, etc. Make sure to get up and move around every 2-3 hours while convalescing to help prevent blood clots. Drink plenty of fluids, and rest as much as possible.    Final Clinical Impressions(s) / UC Diagnoses   Final diagnoses:  Suspected COVID-19 virus infection  Contact with and (suspected) exposure to covid-19     Discharge Instructions     -Tessalon as needed for cough. Take one pill up to 3x daily (every 8 hours) -Promethazine DM cough syrup for congestion/cough. This could make you drowsy, so take at night before bed.  We are currently awaiting result of your PCR covid-19 test. Please isolate at home while awaiting these results. If your test is positive for Covid-19, continue to isolate at home for a total of 10 days from symptom onset. Treat your symptoms at home with OTC remedies like tylenol/ibuprofen, mucinex, nyquil, etc. Seek medical attention if you develop high fevers, chest pain, shortness of breath, ear pain, facial pain, etc. Make sure to get up and move around every 2-3 hours while convalescing to help prevent blood clots. Drink plenty of fluids, and rest as much as possible.     ED Prescriptions    Medication Sig Dispense Auth. Provider   benzonatate (TESSALON) 100 MG capsule Take 1 capsule (100 mg total) by mouth every 8 (eight) hours. 21 capsule Hazel Sams, PA-C   promethazine-dextromethorphan (PROMETHAZINE-DM) 6.25-15 MG/5ML syrup Take 5 mLs by mouth 4 (four) times daily as needed for cough. 118 mL Hazel Sams, PA-C     PDMP not reviewed this encounter.   Hazel Sams, PA-C 02/19/20 1204

## 2020-02-19 NOTE — ED Triage Notes (Signed)
Pt in with c/o congestion and ST that started last night. Also c/o body aches, states her child tested positive for covid 3 days ago  Pt has not had medication for sxs  Denies any N/v, diarrhea

## 2020-02-19 NOTE — Discharge Instructions (Addendum)
-  Tessalon as needed for cough. Take one pill up to 3x daily (every 8 hours) -Promethazine DM cough syrup for congestion/cough. This could make you drowsy, so take at night before bed.  We are currently awaiting result of your PCR covid-19 test. Please isolate at home while awaiting these results. If your test is positive for Covid-19, continue to isolate at home for a total of 10 days from symptom onset. Treat your symptoms at home with OTC remedies like tylenol/ibuprofen, mucinex, nyquil, etc. Seek medical attention if you develop high fevers, chest pain, shortness of breath, ear pain, facial pain, etc. Make sure to get up and move around every 2-3 hours while convalescing to help prevent blood clots. Drink plenty of fluids, and rest as much as possible.

## 2021-01-17 ENCOUNTER — Other Ambulatory Visit: Payer: Self-pay

## 2021-01-17 ENCOUNTER — Ambulatory Visit (HOSPITAL_COMMUNITY)
Admission: EM | Admit: 2021-01-17 | Discharge: 2021-01-17 | Disposition: A | Discharge status: Home / Self Care | Payer: BC Managed Care – PPO | Attending: Internal Medicine | Admitting: Internal Medicine

## 2021-01-17 ENCOUNTER — Encounter (HOSPITAL_COMMUNITY): Payer: Self-pay

## 2021-01-17 DIAGNOSIS — G4486 Cervicogenic headache: Secondary | ICD-10-CM

## 2021-01-17 DIAGNOSIS — M545 Low back pain, unspecified: Secondary | ICD-10-CM | POA: Diagnosis not present

## 2021-01-17 MED ORDER — METHOCARBAMOL 500 MG PO TABS: 500.0000 mg | ORAL_TABLET | Freq: Every evening | ORAL | 0 refills | Status: DC | PRN

## 2021-01-17 MED ORDER — IBUPROFEN 600 MG PO TABS: 600.0000 mg | ORAL_TABLET | Freq: Three times a day (TID) | ORAL | 0 refills | Status: DC | PRN

## 2021-01-17 NOTE — Discharge Instructions (Addendum)
Gentle range of motion exercises Take medications as prescribed If you develop worsening pain, headache, confusion, persistent nausea/vomiting, blurry vision or double vision please go to the emergency department to be evaluated.
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
## 2021-04-06 ENCOUNTER — Emergency Department (HOSPITAL_COMMUNITY)
Admission: EM | Admit: 2021-04-06 | Discharge: 2021-04-06 | Disposition: A | Discharge status: Home / Self Care | Payer: BC Managed Care – PPO | Attending: Emergency Medicine | Admitting: Emergency Medicine

## 2021-04-06 ENCOUNTER — Other Ambulatory Visit: Payer: Self-pay

## 2021-04-06 ENCOUNTER — Encounter (HOSPITAL_COMMUNITY): Payer: Self-pay | Admitting: Emergency Medicine

## 2021-04-06 DIAGNOSIS — R0789 Other chest pain: Secondary | ICD-10-CM | POA: Insufficient documentation

## 2021-04-06 DIAGNOSIS — R197 Diarrhea, unspecified: Secondary | ICD-10-CM | POA: Diagnosis not present

## 2021-04-06 DIAGNOSIS — N9489 Other specified conditions associated with female genital organs and menstrual cycle: Secondary | ICD-10-CM | POA: Diagnosis not present

## 2021-04-06 DIAGNOSIS — R112 Nausea with vomiting, unspecified: Secondary | ICD-10-CM

## 2021-04-06 LAB — CBC WITH DIFFERENTIAL/PLATELET
Abs Immature Granulocytes: 0.02 10*3/uL (ref 0.00–0.07)
Basophils Absolute: 0 10*3/uL (ref 0.0–0.1)
Basophils Relative: 0 %
Eosinophils Absolute: 0 10*3/uL (ref 0.0–0.5)
Eosinophils Relative: 0 %
HCT: 39.1 % (ref 36.0–46.0)
Hemoglobin: 12.1 g/dL (ref 12.0–15.0)
Immature Granulocytes: 0 %
Lymphocytes Relative: 38 %
Lymphs Abs: 3.1 10*3/uL (ref 0.7–4.0)
MCH: 28.1 pg (ref 26.0–34.0)
MCHC: 30.9 g/dL (ref 30.0–36.0)
MCV: 90.9 fL (ref 80.0–100.0)
Monocytes Absolute: 0.6 10*3/uL (ref 0.1–1.0)
Monocytes Relative: 7 %
Neutro Abs: 4.5 10*3/uL (ref 1.7–7.7)
Neutrophils Relative %: 55 %
Platelets: 245 10*3/uL (ref 150–400)
RBC: 4.3 MIL/uL (ref 3.87–5.11)
RDW: 14.2 % (ref 11.5–15.5)
WBC: 8.2 10*3/uL (ref 4.0–10.5)
nRBC: 0 % (ref 0.0–0.2)

## 2021-04-06 LAB — COMPREHENSIVE METABOLIC PANEL
ALT: 39 U/L (ref 0–44)
AST: 36 U/L (ref 15–41)
Albumin: 3.1 g/dL — ABNORMAL LOW (ref 3.5–5.0)
Alkaline Phosphatase: 63 U/L (ref 38–126)
Anion gap: 9 (ref 5–15)
BUN: 9 mg/dL (ref 6–20)
CO2: 23 mmol/L (ref 22–32)
Calcium: 8.9 mg/dL (ref 8.9–10.3)
Chloride: 101 mmol/L (ref 98–111)
Creatinine, Ser: 0.69 mg/dL (ref 0.44–1.00)
GFR, Estimated: >60 mL/min (ref >60)
Glucose, Bld: 134 mg/dL — ABNORMAL HIGH (ref 70–99)
Potassium: 5.4 mmol/L — ABNORMAL HIGH (ref 3.5–5.1)
Sodium: 133 mmol/L — ABNORMAL LOW (ref 135–145)
Total Bilirubin: 2 mg/dL — ABNORMAL HIGH (ref 0.3–1.2)
Total Protein: 7 g/dL (ref 6.5–8.1)

## 2021-04-06 LAB — I-STAT BETA HCG BLOOD, ED (MC, WL, AP ONLY): I-stat hCG, quantitative: <5 m[IU]/mL (ref <5)

## 2021-04-06 LAB — LIPASE, BLOOD: Lipase: 24 U/L (ref 11–51)

## 2021-04-06 MED ORDER — LIDOCAINE VISCOUS HCL 2 % MT SOLN
15.0000 mL | Freq: Once | OROMUCOSAL | Status: AC
  Administered 2021-04-06: 15 mL via ORAL
  Filled 2021-04-06: qty 15

## 2021-04-06 MED ORDER — ONDANSETRON HCL 4 MG/2ML IJ SOLN
4.0000 mg | Freq: Once | INTRAMUSCULAR | Status: AC
  Administered 2021-04-06: 4 mg via INTRAVENOUS
  Filled 2021-04-06: qty 2

## 2021-04-06 MED ORDER — ONDANSETRON 4 MG PO TBDP
4.0000 mg | ORAL_TABLET | Freq: Once | ORAL | Status: AC
  Administered 2021-04-06: 4 mg via ORAL
  Filled 2021-04-06: qty 1

## 2021-04-06 MED ORDER — SODIUM CHLORIDE 0.9 % IV BOLUS
1000.0000 mL | Freq: Once | INTRAVENOUS | Status: AC
  Administered 2021-04-06: 1000 mL via INTRAVENOUS

## 2021-04-06 MED ORDER — ONDANSETRON HCL 4 MG PO TABS
4.0000 mg | ORAL_TABLET | Freq: Four times a day (QID) | ORAL | 0 refills | Status: DC
Start: 2021-04-06 — End: 2021-09-06

## 2021-04-06 MED ORDER — KETOROLAC TROMETHAMINE 15 MG/ML IJ SOLN
15.0000 mg | Freq: Once | INTRAMUSCULAR | Status: AC
  Administered 2021-04-06: 15 mg via INTRAVENOUS
  Filled 2021-04-06: qty 1

## 2021-04-06 MED ORDER — ALUM & MAG HYDROXIDE-SIMETH 200-200-20 MG/5ML PO SUSP
30.0000 mL | Freq: Once | ORAL | Status: AC
  Administered 2021-04-06: 30 mL via ORAL
  Filled 2021-04-06: qty 30

## 2021-04-06 NOTE — ED Triage Notes (Signed)
Patient with nausea, vomiting, diarrhea and chest pain for the last two days.  She also having headache.  No fevers.  Patient's daughter with same symptoms.

## 2021-04-06 NOTE — ED Provider Notes (Signed)
[facility] Provider Note   CSN: [id number] Arrival date & time: 04/06/21  0559     History  Chief Complaint  Patient presents with   Emesis   Nausea   Chest Pain    Yolanda Martinez is a 26 y.o. female.  26 year old female with prior medical history as detailed below presents for evaluation.  Patient reports onset of nausea, vomiting, and diarrhea 2 days prior.  Vomiting has improved.  Diarrhea has persisted.  She has sick contacts at home with similar symptoms.  She reports multiple episodes of emesis initially.  She does complain of a anterior chest wall achiness after her prior rounds of throwing up.  She denies fever.  She denies abdominal pain.  She is taking good p.o. now. She has not taken anything for her symptoms at home.   The history is provided by the patient and medical records.  Emesis Severity:  Moderate Duration:  2 days Timing:  Intermittent Able to tolerate:  Liquids Progression:  Partially resolved Chronicity:  New Chest Pain Associated symptoms: vomiting       Home Medications Prior to Admission medications   Medication Sig Start Date End Date Taking? Authorizing Provider  ibuprofen (ADVIL) 600 MG tablet Take 1 tablet (600 mg total) by mouth every 8 (eight) hours as needed. 01/17/21   Lamptey, Myrene Galas, MD  methocarbamol (ROBAXIN) 500 MG tablet Take 1 tablet (500 mg total) by mouth at bedtime as needed for muscle spasms. 01/17/21   Chase Picket, MD  pravastatin (PRAVACHOL) 20 MG tablet Take 1 tablet (20 mg total) by mouth daily. 09/23/19   Kerin Perna, NP  promethazine-dextromethorphan (PROMETHAZINE-DM) 6.25-15 MG/5ML syrup Take 5 mLs by mouth 4 (four) times daily as needed for cough. 02/19/20   Hazel Sams, PA-C  sodium chloride (OCEAN) 0.65 % SOLN nasal spray Place 1 spray into both nostrils as needed for congestion. Patient not taking: Reported on 09/06/2019 06/20/19 02/19/20  Chase Picket, MD       Allergies    Patient has no known allergies.    Review of Systems   Review of Systems  Cardiovascular:  Positive for chest pain.  Gastrointestinal:  Positive for vomiting.  All other systems reviewed and are negative.  Physical Exam Updated Vital Signs BP (!) 98/56    Pulse 68    Temp 98.9 F (37.2 C) (Oral)    Resp 20    Ht 5' 3.5" (1.613 m)    Wt 102.1 kg    LMP 03/21/2021 (Exact Date)    SpO2 100%    BMI 39.23 kg/m  Physical Exam Vitals and nursing note reviewed.  Constitutional:      General: She is not in acute distress.    Appearance: Normal appearance. She is well-developed.  HENT:     Head: Normocephalic and atraumatic.  Eyes:     Conjunctiva/sclera: Conjunctivae normal.     Pupils: Pupils are equal, round, and reactive to light.  Cardiovascular:     Rate and Rhythm: Normal rate and regular rhythm.     Heart sounds: Normal heart sounds.  Pulmonary:     Effort: Pulmonary effort is normal. No respiratory distress.     Breath sounds: Normal breath sounds.  Chest:     Comments: Mild tenderness with palpation over the mid sternal chest wall.  This reproduces the patient's symptoms entirely. Abdominal:     General: There is no distension.     Palpations: Abdomen  is soft.     Tenderness: There is no abdominal tenderness.  Musculoskeletal:        General: No deformity. Normal range of motion.     Cervical back: Normal range of motion and neck supple.  Skin:    General: Skin is warm and dry.  Neurological:     General: No focal deficit present.     Mental Status: She is alert and oriented to person, place, and time.    ED Results / Procedures / Treatments   Labs (all labs ordered are listed, but only abnormal results are displayed) Labs Reviewed  COMPREHENSIVE METABOLIC PANEL - Abnormal; Notable for the following components:      Result Value   Sodium 133 (*)    Potassium 5.4 (*)    Glucose, Bld 134 (*)    Albumin 3.1 (*)    Total Bilirubin 2.0 (*)     All other components within normal limits  CBC WITH DIFFERENTIAL/PLATELET  LIPASE, BLOOD  I-STAT BETA HCG BLOOD, ED ([facility], [facility], [facility] ONLY)    EKG EKG Interpretation  Date/Time:  Saturday April 06 2021 08:27:04 EST Ventricular Rate:  70 PR Interval:  156 QRS Duration: 82 QT Interval:  400 QTC Calculation: 432 R Axis:   64 Text Interpretation: Sinus rhythm Confirmed by Dene Gentry [phone]) on 04/06/2021 8:46:27 AM  Radiology No results found.  Procedures Procedures    Medications Ordered in ED Medications  ondansetron (ZOFRAN-ODT) disintegrating tablet 4 mg (4 mg Oral Given 04/06/21 0639)  alum & mag hydroxide-simeth (MAALOX/MYLANTA) 200-200-20 MG/5ML suspension 30 mL (30 mLs Oral Given 04/06/21 0658)    And  lidocaine (XYLOCAINE) 2 % viscous mouth solution 15 mL (15 mLs Oral Given 04/06/21 0658)  sodium chloride 0.9 % bolus 1,000 mL (0 mLs Intravenous Stopped 04/06/21 0930)  ondansetron (ZOFRAN) injection 4 mg (4 mg Intravenous Given 04/06/21 0832)  ketorolac (TORADOL) 15 MG/ML injection 15 mg (15 mg Intravenous Given 04/06/21 [id number])    ED Course/ Medical Decision Making/ A&P                           Medical Decision Making Amount and/or Complexity of Data Reviewed Labs: ordered.  Risk Prescription drug management.    Medical Screen Complete  This patient presented to the ED with complaint of nausea, vomiting, diarrhea, atypical chest discomfort.  This complaint involves an extensive number of treatment options. The initial differential diagnosis includes, but is not limited to, likely viral gastroenteritis, metabolic abnormality, dehydration, AKI, etc.  This presentation is: Acute, Self-Limited, Previously Undiagnosed, Uncertain Prognosis, Complicated, Systemic Symptoms, and Threat to Life/Bodily Function  Patient is presenting with complaint of nausea, vomiting, diarrhea.  Symptoms began 2 days ago.  Symptoms are improving.  Patient with known sick contacts in her  household with similar symptoms.  Patient does describe atypical chest discomfort.  Describe symptoms are not consistent with likely ACS.  Chest wall is tender with palpation this reproduces the patient's described pain entirely.  Patient is chest wall pain is likely secondary to multiple rounds of emesis 2 days prior.  Screening labs obtained are without significant abnormality.  Patient feels significantly proved after IV fluids and antiemetics here in the ED.  She now desires DC home.  Importance of close follow-up is stressed.  Strict return precautions given and understood.    Additional history obtained:  External records from outside sources obtained and reviewed including prior ED visits and prior Inpatient  records.    Lab Tests:  I ordered and personally interpreted labs.  The pertinent results include: CBC, CMP, hCG, lipase   Cardiac Monitoring:  The patient was maintained on a cardiac monitor.  I personally viewed and interpreted the cardiac monitor which showed an underlying rhythm of: NSR   Medicines ordered:  I ordered medication including IV fluids, Toradol, Zofran for dehydration, pain, nausea Reevaluation of the patient after these medicines showed that the patient: improved    Problem List / ED Course:  Nausea, vomiting, diarrhea, atypical chest discomfort   Reevaluation:  After the interventions noted above, I reevaluated the patient and found that they have: improved   Disposition:  After consideration of the diagnostic results and the patients response to treatment, I feel that the patent would benefit from close outpatient follow-up.          Final Clinical Impression(s) / ED Diagnoses Final diagnoses:  Nausea and vomiting, unspecified vomiting type  Diarrhea, unspecified type    Rx / DC Orders ED Discharge Orders          Ordered    ondansetron (ZOFRAN) 4 MG tablet  Every 6 hours        04/06/21 1032               Valarie Merino, MD 04/06/21 [phone]

## 2021-04-06 NOTE — Discharge Instructions (Signed)
Return for any problem.  ?

## 2021-04-06 NOTE — ED Provider Triage Note (Signed)
Emergency Medicine Provider Triage Evaluation Note  Yolanda Martinez , a 26 y.o. female  was evaluated in triage.  Pt complains of central chest discomfort which began after V/D x 2 days. Last episode of emesis was yesterday; now tolerating PO. No fevers, urinary symptoms. Abdominal SHx notable for C-section x 1. Daughter on pediatric ED side for V/D complaints.  Review of Systems  Positive: As above Negative: As above  Physical Exam  BP 127/86 (BP Location: Right Arm)    Pulse 73    Temp 98.9 F (37.2 C) (Oral)    Resp 15    SpO2 99%  Gen:   Awake, no distress   Resp:  Normal effort  MSK:   Moves extremities without difficulty  Other:  Soft, obese abdomen  Medical Decision Making  Medically screening exam initiated at 6:35 AM.  Appropriate orders placed.  Yolanda Martinez was informed that the remainder of the evaluation will be completed by another provider, this initial triage assessment does not replace that evaluation, and the importance of remaining in the ED until their evaluation is complete.  V/D - likely viral etiology as daughter is presenting tonight with similar complaints.  Will give Zofran and GI cocktail for symptom management.  Vital stable in triage.  No signs of acute surgical abdomen at this time.   Yolanda Madura, PA-C 04/06/21 [phone]

## 2021-04-26 ENCOUNTER — Encounter (HOSPITAL_COMMUNITY): Payer: Self-pay

## 2021-04-26 ENCOUNTER — Ambulatory Visit (HOSPITAL_COMMUNITY)
Admission: EM | Admit: 2021-04-26 | Discharge: 2021-04-26 | Disposition: A | Discharge status: Home / Self Care | Payer: BC Managed Care – PPO | Attending: Student | Admitting: Student

## 2021-04-26 ENCOUNTER — Other Ambulatory Visit: Payer: Self-pay

## 2021-04-26 DIAGNOSIS — N926 Irregular menstruation, unspecified: Secondary | ICD-10-CM | POA: Diagnosis present

## 2021-04-26 DIAGNOSIS — Z3202 Encounter for pregnancy test, result negative: Secondary | ICD-10-CM | POA: Insufficient documentation

## 2021-04-26 LAB — POC URINE PREG, ED: Preg Test, Ur: NEGATIVE

## 2021-04-26 LAB — HCG, QUANTITATIVE, PREGNANCY: hCG, Beta Chain, Quant, S: <1 m[IU]/mL (ref <5)

## 2021-04-26 NOTE — ED Provider Notes (Signed)
?[facility] ? ? ? ?CSN: [id number] ?Arrival date & time: 04/26/21  1608 ? ? ?  ? ?History   ?Chief Complaint ?Chief Complaint  ?Patient presents with  ? Possible Pregnancy  ? ? ?HPI ?Yolanda Martinez is a 26 y.o. female presenting for pregnancy test.  She is about 1 week late for her period, last menstrual period was 03/22/2021.  Multiple negative home tests, so she is requesting a blood test. States she feels well but her breasts feel tender and she's having some crampy lower abd pain consistent with PMS. Denies dysuria, frequency, urgency, flank pain, fevers/chills, vaginal discharge, STI risk.  ? ?HPI ? ?Past Medical History:  ?Diagnosis Date  ? Medical history non-contributory   ? Vaginal Pap smear, abnormal   ? ? ?Patient Active Problem List  ? Diagnosis Date Noted  ? Status post primary low transverse cesarean section 06/21/2017  ? Labor and delivery, indication for care 06/18/2017  ? ? ?Past Surgical History:  ?Procedure Laterality Date  ? CESAREAN SECTION N/A 06/19/2017  ? Procedure: CESAREAN SECTION;  Surgeon: Tereso Newcomer, MD;  Location: [facility];  Service: Obstetrics;  Laterality: N/A;  ? NO PAST SURGERIES    ? WISDOM TOOTH EXTRACTION    ? ? ?OB History   ? ? Gravida  ?1  ? Para  ?1  ? Term  ?1  ? Preterm  ?   ? AB  ?   ? Living  ?1  ?  ? ? SAB  ?   ? IAB  ?   ? Ectopic  ?   ? Multiple  ?0  ? Live Births  ?1  ?   ?  ?  ? ? ? ?Home Medications   ? ?Prior to Admission medications   ?Medication Sig Start Date End Date Taking? Authorizing Provider  ?ibuprofen (ADVIL) 600 MG tablet Take 1 tablet (600 mg total) by mouth every 8 (eight) hours as needed. 01/17/21   LampteyBritta Mccreedy, MD  ?methocarbamol (ROBAXIN) 500 MG tablet Take 1 tablet (500 mg total) by mouth at bedtime as needed for muscle spasms. 01/17/21   LampteyBritta Mccreedy, MD  ?ondansetron (ZOFRAN) 4 MG tablet Take 1 tablet (4 mg total) by mouth every 6 (six) hours. 04/06/21   Wynetta Fines, MD  ?pravastatin (PRAVACHOL) 20 MG  tablet Take 1 tablet (20 mg total) by mouth daily. 09/23/19   Grayce Sessions, NP  ?promethazine-dextromethorphan (PROMETHAZINE-DM) 6.25-15 MG/5ML syrup Take 5 mLs by mouth 4 (four) times daily as needed for cough. 02/19/20   Rhys Martini, PA-C  ?sodium chloride (OCEAN) 0.65 % SOLN nasal spray Place 1 spray into both nostrils as needed for congestion. ?Patient not taking: Reported on 09/06/2019 06/20/19 02/19/20  Merrilee Jansky, MD  ? ? ?Family History ?Family History  ?Problem Relation Age of Onset  ? Diabetes Mother   ? Diabetes Father   ? ? ?Social History ?Social History  ? ?Tobacco Use  ? Smoking status: Former  ?  Types: Cigars  ? Smokeless tobacco: Never  ? Tobacco comments:  ?  quit prior to preg  ?Substance Use Topics  ? Alcohol use: Yes  ?  Comment: occ  ? Drug use: Yes  ?  Types: Marijuana  ?  Comment: Aug 2018  ? ? ? ?Allergies   ?Patient has no known allergies. ? ? ?Review of Systems ?Review of Systems  ?Gastrointestinal:  Negative for abdominal pain.  ?All other systems reviewed and  are negative. ? ? ?Physical Exam ?Triage Vital Signs ?ED Triage Vitals  ?Enc Vitals Group  ?   BP   ?   Pulse   ?   Resp   ?   Temp   ?   Temp src   ?   SpO2   ?   Weight   ?   Height   ?   Head Circumference   ?   Peak Flow   ?   Pain Score   ?   Pain Loc   ?   Pain Edu?   ?   Excl. in GC?   ? ?No data found. ? ?Updated Vital Signs ?BP 123/87 (BP Location: Left Arm)   Pulse 97   Temp 98.9 ?F (37.2 ?C) (Oral)   Resp 16   LMP 03/22/2021 (Approximate)   SpO2 100%  ? ?Visual Acuity ?Right Eye Distance:   ?Left Eye Distance:   ?Bilateral Distance:   ? ?Right Eye Near:   ?Left Eye Near:    ?Bilateral Near:    ? ?Physical Exam ?Vitals reviewed.  ?Constitutional:   ?   General: She is not in acute distress. ?   Appearance: Normal appearance. She is not ill-appearing.  ?HENT:  ?   Head: Normocephalic and atraumatic.  ?   Mouth/Throat:  ?   Mouth: Mucous membranes are moist.  ?   Comments: Moist mucous membranes ?Eyes:  ?    Extraocular Movements: Extraocular movements intact.  ?   Pupils: Pupils are equal, round, and reactive to light.  ?Cardiovascular:  ?   Rate and Rhythm: Normal rate and regular rhythm.  ?   Heart sounds: Normal heart sounds.  ?Pulmonary:  ?   Effort: Pulmonary effort is normal.  ?   Breath sounds: Normal breath sounds. No wheezing, rhonchi or rales.  ?Abdominal:  ?   General: Bowel sounds are normal. There is no distension.  ?   Palpations: Abdomen is soft. There is no mass.  ?   Tenderness: There is no abdominal tenderness. There is no right CVA tenderness, left CVA tenderness, guarding or rebound. Negative signs include Murphy's sign, Rovsing's sign and McBurney's sign.  ?Skin: ?   General: Skin is warm.  ?   Capillary Refill: Capillary refill takes less than 2 seconds.  ?   Comments: Good skin turgor  ?Neurological:  ?   General: No focal deficit present.  ?   Mental Status: She is alert and oriented to person, place, and time.  ?Psychiatric:     ?   Mood and Affect: Mood normal.     ?   Behavior: Behavior normal.  ? ? ? ?UC Treatments / Results  ?Labs ?(all labs ordered are listed, but only abnormal results are displayed) ?Labs Reviewed  ?HCG, QUANTITATIVE, PREGNANCY  ?POC URINE PREG, ED  ? ? ?EKG ? ? ?Radiology ?No results found. ? ?Procedures ?Procedures (including critical care time) ? ?Medications Ordered in UC ?Medications - No data to display ? ?Initial Impression / Assessment and Plan / UC Course  ?I have reviewed the triage vital signs and the nursing notes. ? ?Pertinent labs & imaging results that were available during my care of the patient were reviewed by me and considered in my medical decision making (see chart for details). ? ?  ? ?This patient is a very pleasant 26 y.o. year old female presenting with negative pregnancy test. LMP 03/22/21. U-preg negative. She is requesting quant Hcg; sent. ED return  precautions discussed. Patient verbalizes understanding and agreement.  ?.  ? ?Final Clinical  Impressions(s) / UC Diagnoses  ? ?Final diagnoses:  ?Pregnancy test negative  ?Irregular periods  ? ?Discharge Instructions   ?None ?  ? ?ED Prescriptions   ?None ?  ? ?PDMP not reviewed this encounter. ?  ?Rhys MartiniGraham, Seeley Southgate E, PA-C ?04/26/21 1739 ? ?

## 2021-04-26 NOTE — ED Triage Notes (Signed)
Pt reports her period is one month late. She is requesting a UPT and HCG level check. ?

## 2021-08-31 ENCOUNTER — Encounter (HOSPITAL_COMMUNITY): Payer: Self-pay | Admitting: Emergency Medicine

## 2021-08-31 ENCOUNTER — Ambulatory Visit (HOSPITAL_COMMUNITY)
Admission: EM | Admit: 2021-08-31 | Discharge: 2021-08-31 | Disposition: A | Discharge status: Home / Self Care | Payer: BC Managed Care – PPO | Attending: Internal Medicine | Admitting: Internal Medicine

## 2021-08-31 DIAGNOSIS — N898 Other specified noninflammatory disorders of vagina: Secondary | ICD-10-CM | POA: Diagnosis present

## 2021-08-31 DIAGNOSIS — Z113 Encounter for screening for infections with a predominantly sexual mode of transmission: Secondary | ICD-10-CM | POA: Diagnosis present

## 2021-08-31 LAB — POCT URINALYSIS DIPSTICK, ED / UC
Glucose, UA: NEGATIVE mg/dL
Hgb urine dipstick: NEGATIVE
Ketones, ur: 80 mg/dL — AB
Leukocytes,Ua: NEGATIVE
Nitrite: NEGATIVE
Protein, ur: 30 mg/dL — AB
Specific Gravity, Urine: >=1.03 (ref 1.005–1.030)
Urobilinogen, UA: 0.2 mg/dL (ref 0.0–1.0)
pH: 5.5 (ref 5.0–8.0)

## 2021-08-31 LAB — COMPREHENSIVE METABOLIC PANEL
ALT: 14 U/L (ref 0–44)
AST: 18 U/L (ref 15–41)
Albumin: 3.5 g/dL (ref 3.5–5.0)
Alkaline Phosphatase: 63 U/L (ref 38–126)
Anion gap: 17 — ABNORMAL HIGH (ref 5–15)
BUN: 6 mg/dL (ref 6–20)
CO2: 21 mmol/L — ABNORMAL LOW (ref 22–32)
Calcium: 9.9 mg/dL (ref 8.9–10.3)
Chloride: 103 mmol/L (ref 98–111)
Creatinine, Ser: 0.64 mg/dL (ref 0.44–1.00)
GFR, Estimated: >60 mL/min (ref >60)
Glucose, Bld: 124 mg/dL — ABNORMAL HIGH (ref 70–99)
Potassium: 3.6 mmol/L (ref 3.5–5.1)
Sodium: 141 mmol/L (ref 135–145)
Total Bilirubin: 0.7 mg/dL (ref 0.3–1.2)
Total Protein: 7.8 g/dL (ref 6.5–8.1)

## 2021-08-31 LAB — POC URINE PREG, ED: Preg Test, Ur: NEGATIVE

## 2021-08-31 LAB — HIV ANTIBODY (ROUTINE TESTING W REFLEX): HIV Screen 4th Generation wRfx: NONREACTIVE

## 2021-08-31 NOTE — Discharge Instructions (Signed)
Your vaginal swab and STD tests are pending.  We will call if they are abnormal and treat as appropriate.  Please refrain from sexual activity until test results and treatment are complete.

## 2021-08-31 NOTE — ED Provider Notes (Signed)
[facility]    CSN: [id number] Arrival date & time: 08/31/21  1256      History   Chief Complaint Chief Complaint  Patient presents with   Vaginal Discharge    HPI Ebelin Dillehay is a 26 y.o. female.   Patient presents with clear vaginal discharge that started a few days prior.  Denies dysuria, urinary frequency, abdominal pain, pelvic pain, hematuria, abnormal vaginal bleeding, back pain, fever.  Denies any known exposure to STD but would like STD testing including blood work for HIV and syphilis. Last menstrual cycle was 6/24.    Vaginal Discharge   Past Medical History:  Diagnosis Date   Medical history non-contributory    Vaginal Pap smear, abnormal     Patient Active Problem List   Diagnosis Date Noted   Status post primary low transverse cesarean section 06/21/2017   Labor and delivery, indication for care 06/18/2017    Past Surgical History:  Procedure Laterality Date   CESAREAN SECTION N/A 06/19/2017   Procedure: CESAREAN SECTION;  Surgeon: Tereso Newcomer, MD;  Location: [facility];  Service: Obstetrics;  Laterality: N/A;   NO PAST SURGERIES     WISDOM TOOTH EXTRACTION      OB History     Gravida  1   Para  1   Term  1   Preterm      AB      Living  1      SAB      IAB      Ectopic      Multiple  0   Live Births  1            Home Medications    Prior to Admission medications   Medication Sig Start Date End Date Taking? Authorizing Provider  ibuprofen (ADVIL) 600 MG tablet Take 1 tablet (600 mg total) by mouth every 8 (eight) hours as needed. 01/17/21   Lamptey, Britta Mccreedy, MD  methocarbamol (ROBAXIN) 500 MG tablet Take 1 tablet (500 mg total) by mouth at bedtime as needed for muscle spasms. 01/17/21   Lamptey, Britta Mccreedy, MD  ondansetron (ZOFRAN) 4 MG tablet Take 1 tablet (4 mg total) by mouth every 6 (six) hours. 04/06/21   Wynetta Fines, MD  pravastatin (PRAVACHOL) 20 MG tablet Take 1 tablet (20 mg  total) by mouth daily. 09/23/19   Grayce Sessions, NP  promethazine-dextromethorphan (PROMETHAZINE-DM) 6.25-15 MG/5ML syrup Take 5 mLs by mouth 4 (four) times daily as needed for cough. 02/19/20   Rhys Martini, PA-C  sodium chloride (OCEAN) 0.65 % SOLN nasal spray Place 1 spray into both nostrils as needed for congestion. Patient not taking: Reported on 09/06/2019 06/20/19 02/19/20  Merrilee Jansky, MD    Family History Family History  Problem Relation Age of Onset   Diabetes Mother    Diabetes Father     Social History Social History   Tobacco Use   Smoking status: Former    Types: Cigars   Smokeless tobacco: Never   Tobacco comments:    quit prior to preg  Substance Use Topics   Alcohol use: Yes    Comment: occ   Drug use: Yes    Types: Marijuana    Comment: Aug 2018     Allergies   Patient has no known allergies.   Review of Systems Review of Systems Per HPI  Physical Exam Triage Vital Signs ED Triage Vitals  Enc Vitals Group  BP 08/31/21 1330 134/83     Pulse Rate 08/31/21 1330 100     Resp 08/31/21 1330 17     Temp 08/31/21 1330 98 F (36.7 C)     Temp Source 08/31/21 1330 Oral     SpO2 08/31/21 1330 97 %     Weight --      Height --      Head Circumference --      Peak Flow --      Pain Score 08/31/21 1328 0     Pain Loc --      Pain Edu? --      Excl. in GC? --    No data found.  Updated Vital Signs BP 134/83 (BP Location: Right Arm)   Pulse 100   Temp 98 F (36.7 C) (Oral)   Resp 17   LMP 08/11/2021   SpO2 97%   Visual Acuity Right Eye Distance:   Left Eye Distance:   Bilateral Distance:    Right Eye Near:   Left Eye Near:    Bilateral Near:     Physical Exam Constitutional:      General: She is not in acute distress.    Appearance: Normal appearance. She is not toxic-appearing or diaphoretic.  HENT:     Head: Normocephalic and atraumatic.  Eyes:     Extraocular Movements: Extraocular movements intact.      Conjunctiva/sclera: Conjunctivae normal.  Cardiovascular:     Rate and Rhythm: Normal rate and regular rhythm.     Pulses: Normal pulses.     Heart sounds: Normal heart sounds.  Pulmonary:     Effort: Pulmonary effort is normal. No respiratory distress.     Breath sounds: Normal breath sounds.  Abdominal:     General: Bowel sounds are normal. There is no distension.     Palpations: Abdomen is soft.     Tenderness: There is no abdominal tenderness.  Genitourinary:    Comments: Deferred with shared decision-making.  Self swab performed. Neurological:     General: No focal deficit present.     Mental Status: She is alert and oriented to person, place, and time. Mental status is at baseline.  Psychiatric:        Mood and Affect: Mood normal.        Behavior: Behavior normal.        Thought Content: Thought content normal.        Judgment: Judgment normal.      UC Treatments / Results  Labs (all labs ordered are listed, but only abnormal results are displayed) Labs Reviewed  POCT URINALYSIS DIPSTICK, ED / UC - Abnormal; Notable for the following components:      Result Value   Bilirubin Urine SMALL (*)    Ketones, ur 80 (*)    Protein, ur 30 (*)    All other components within normal limits  RPR  HIV ANTIBODY (ROUTINE TESTING W REFLEX)  COMPREHENSIVE METABOLIC PANEL  POC URINE PREG, ED  CERVICOVAGINAL ANCILLARY ONLY    EKG   Radiology No results found.  Procedures Procedures (including critical care time)  Medications Ordered in UC Medications - No data to display  Initial Impression / Assessment and Plan / UC Course  I have reviewed the triage vital signs and the nursing notes.  Pertinent labs & imaging results that were available during my care of the patient were reviewed by me and considered in my medical decision making (see chart for details).  Urinalysis completed by nursing protocol prior to provider examination.  It was unremarkable for UTI but did  show small amount of bilirubin, protein, ketones.  Highly suspicious that this is related to low water intake, although will obtain CBC given patient had mildly elevated bilirubin in February 2023.  Patient is not exhibiting any other signs or symptoms of worrisome etiologies and no abdominal pain so do not think that any emergent evaluation in the hospital is necessary.  Glucose is also negative in urine and patient does not have history of diabetes so low concern for hypoglycemia.  Cervicovaginal swab, CMP, HIV, RPR pending.  Will await results for any further treatment.  Patient advised to refrain from sexual activity until test results and treatment are complete.  Discussed return and ER precautions.  Patient verbalized understanding and was agreeable with plan. Final Clinical Impressions(s) / UC Diagnoses   Final diagnoses:  Vaginal discharge  Screening examination for venereal disease     Discharge Instructions      Your vaginal swab and STD tests are pending.  We will call if they are abnormal and treat as appropriate.  Please refrain from sexual activity until test results and treatment are complete.    ED Prescriptions   None    PDMP not reviewed this encounter.   Gustavus Bryant, [place] 08/31/21 [phone]

## 2021-08-31 NOTE — ED Triage Notes (Signed)
Pt reports that having vaginal discharge and irritation for 3 days. Reports little odor. Wanting STD testing.

## 2021-09-01 LAB — RPR: RPR Ser Ql: NONREACTIVE

## 2021-09-02 LAB — CERVICOVAGINAL ANCILLARY ONLY
Bacterial Vaginitis (gardnerella): NEGATIVE
Candida Glabrata: NEGATIVE
Candida Vaginitis: NEGATIVE
Chlamydia: NEGATIVE
Comment: NEGATIVE
Comment: NEGATIVE
Comment: NEGATIVE
Comment: NEGATIVE
Comment: NEGATIVE
Comment: NORMAL
Neisseria Gonorrhea: NEGATIVE
Trichomonas: NEGATIVE

## 2021-09-06 ENCOUNTER — Ambulatory Visit (INDEPENDENT_AMBULATORY_CARE_PROVIDER_SITE_OTHER): Payer: BC Managed Care – PPO | Admitting: Primary Care

## 2021-09-06 ENCOUNTER — Encounter (INDEPENDENT_AMBULATORY_CARE_PROVIDER_SITE_OTHER): Payer: Self-pay | Admitting: Primary Care

## 2021-09-06 VITALS — BP 117/78 | HR 79 | Temp 98.1°F | Ht 63.0 in | Wt 211.0 lb

## 2021-09-06 DIAGNOSIS — E119 Type 2 diabetes mellitus without complications: Secondary | ICD-10-CM

## 2021-09-06 DIAGNOSIS — Z131 Encounter for screening for diabetes mellitus: Secondary | ICD-10-CM | POA: Diagnosis not present

## 2021-09-06 LAB — POCT GLYCOSYLATED HEMOGLOBIN (HGB A1C): Hemoglobin A1C: 9.2 % — AB (ref 4.0–5.6)

## 2021-09-06 MED ORDER — JANUMET 50-1000 MG PO TABS
1.0000 | ORAL_TABLET | Freq: Two times a day (BID) | ORAL | 1 refills | Status: DC
  Filled 2021-09-09: qty 60, 30d supply, fill #0

## 2021-09-06 MED ORDER — GLIPIZIDE ER 10 MG PO TB24
10.0000 mg | ORAL_TABLET | Freq: Every day | ORAL | 1 refills | Status: DC
  Filled 2021-09-09: qty 90, 90d supply, fill #0

## 2021-09-06 NOTE — Patient Instructions (Signed)

## 2021-09-09 ENCOUNTER — Other Ambulatory Visit: Payer: Self-pay

## 2021-09-15 NOTE — Progress Notes (Signed)
[facility]  [place], is a 26 y.o. female  OFB:[id number]  POE:[id number]  DOB - October 09, 1995  Chief Complaint  Patient presents with   screening for diabetes       Subjective:   Yolanda Martinez is a 26 y.o. female here today for a questioning if she is diabetic- increase urination, thirst and vision changes.  Patient has No headache, No chest pain, No abdominal pain - No Nausea, No new weakness tingling or numbness, No Cough - shortness of breath  No problems updated.  No Known Allergies  Past Medical History:  Diagnosis Date   Medical history non-contributory    Vaginal Pap smear, abnormal     Current Outpatient Medications on File Prior to Visit  Medication Sig Dispense Refill   metroNIDAZOLE (FLAGYL) 500 MG tablet Take 500 mg by mouth 2 (two) times daily.     [DISCONTINUED] sodium chloride (OCEAN) 0.65 % SOLN nasal spray Place 1 spray into both nostrils as needed for congestion. (Patient not taking: Reported on 09/06/2019)  0   No current facility-administered medications on file prior to visit.    Objective:   Vitals:   09/06/21 1132  BP: 117/78  Pulse: 79  Temp: 98.1 F (36.7 C)  TempSrc: Oral  SpO2: 98%  Weight: 211 lb (95.7 kg)  Height: 5\' 3"  (1.6 m)    Exam General appearance : Awake, alert, not in any distress. Speech Clear. Not toxic looking HEENT: Atraumatic and Normocephalic, pupils equally reactive to light and accomodation Neck: Supple, no JVD. No cervical lymphadenopathy.  Chest: Good air entry bilaterally, no added sounds  CVS: S1 S2 regular, no murmurs.  Abdomen: Bowel sounds present, Non tender and not distended with no gaurding, rigidity or rebound. Extremities: B/L Lower Ext shows no edema, both legs are warm to touch Neurology: Awake alert, and oriented X 3,  Non focal Skin: No Rash  Data Review Lab Results  Component Value Date   HGBA1C 9.2 (A) 09/06/2021    Assessment & Plan   1. Screening for  diabetes mellitus - HgB A1c 9.2  2. Type 2 diabetes mellitus without complication, without long-term current use of insulin (HCC) Discussed  co- morbidities with uncontrol diabetes  Complications -diabetic retinopathy, (close your eyes ? What do you see nothing) nephropathy decrease in kidney function- can lead to dialysis-on a machine 3 days a week to filter your kidney, neuropathy- numbness and tinging in your hands and feet,  increase risk of heart attack and stroke, and amputation due to decrease wound healing and circulation. Decrease your risk by taking medication daily as prescribed, monitor carbohydrates- foods that are high in carbohydrates are the following rice, potatoes, breads, sugars, and pastas.  Reduction in the intake (eating) will assist in lowering your blood sugars. Exercise daily at least 30 minutes daily.  - sitaGLIPtin-metformin (JANUMET) 50-1000 MG tablet; Take 1 tablet by mouth 2 (two) times daily with a meal.  Dispense: 60 tablet; Refill: 1 - glipiZIDE (GLUCOTROL XL) 10 MG 24 hr tablet; Take 1 tablet (10 mg total) by mouth daily with breakfast.  Dispense: 90 tablet; Refill: 1  Patient have been counseled extensively about nutrition and exercise. Other issues discussed during this visit include: low cholesterol diet, weight control and daily exercise, foot care, annual eye examinations at Ophthalmology, importance of adherence with medications and regular follow-up. We also discussed long term complications of uncontrolled diabetes and hypertension.   Return in about 3 months (around 12/07/2021) for DM.  The patient  was given clear instructions to go to ER or return to medical center if symptoms don't improve, worsen or new problems develop. The patient verbalized understanding. The patient was told to call to get lab results if they haven't heard anything in the next week.   This note has been created with Education officer, environmental. Any  transcriptional errors are unintentional.   Grayce Sessions, NP 09/15/2021, 8:45 PM

## 2021-11-20 ENCOUNTER — Other Ambulatory Visit: Payer: Self-pay

## 2021-11-20 ENCOUNTER — Encounter (HOSPITAL_COMMUNITY): Payer: Self-pay | Admitting: Emergency Medicine

## 2021-11-20 ENCOUNTER — Ambulatory Visit (HOSPITAL_COMMUNITY)
Admission: EM | Admit: 2021-11-20 | Discharge: 2021-11-20 | Disposition: A | Discharge status: Home / Self Care | Payer: BC Managed Care – PPO | Attending: Internal Medicine | Admitting: Internal Medicine

## 2021-11-20 DIAGNOSIS — J069 Acute upper respiratory infection, unspecified: Secondary | ICD-10-CM

## 2021-11-20 DIAGNOSIS — R0981 Nasal congestion: Secondary | ICD-10-CM

## 2021-11-20 MED ORDER — BENZONATATE 100 MG PO CAPS: 100.0000 mg | ORAL_CAPSULE | Freq: Three times a day (TID) | ORAL | 0 refills | Status: DC

## 2021-11-20 MED ORDER — GUAIFENESIN ER 1200 MG PO TB12: 1200.0000 mg | ORAL_TABLET | Freq: Two times a day (BID) | ORAL | 0 refills | Status: DC

## 2021-11-20 NOTE — Discharge Instructions (Signed)
You have a viral upper respiratory infection.   Take guaifenesin 1200 mg every 12 hours to thin your mucous so that you can get it out of your body easier with coughing/blowing your nose. Drink plenty of water while taking this medication so that it works well in your body (at least 8 cups a day).   Take tessalon pearles every 8 hours as needed for cough.  You may take tylenol 1,000mg  and ibuprofen 600mg  every 6 hours with food as needed for fever/chills, sore throat, aches/pains, and inflammation associated with viral illness. Take this with food to avoid stomach upset.    You may do salt water and baking soda gargles every 4 hours as needed for your throat pain.  Please put 1 teaspoon of salt and 1/2 teaspoon of baking soda in 8 ounces of warm water then gargle and spit the water out. You may also put 1 tablespoon of honey in warm water and drink this to soothe your throat.  Place a humidifier in your room at night to help decrease dry air that can irritate your airway and cause you to have a sore throat and cough.  Please try to eat a well-balanced diet while you are sick so that your body gets proper nutrition to heal.  If you develop any new or worsening symptoms, please return.  If your symptoms are severe, please go to the emergency room.  Follow-up with your primary care provider for further evaluation and management of your symptoms as well as ongoing wellness visits.  I hope you feel better!

## 2021-11-20 NOTE — ED Provider Notes (Signed)
[facility]    CSN: [id number] Arrival date & time: 11/20/21  2992      History   Chief Complaint Chief Complaint  Patient presents with   Cough   Nasal Congestion    HPI Yolanda Martinez is a 26 y.o. female.   Patient presents to urgent care for evaluation of cough, nasal congestion, sore throat, headache, and fatigue that started approximately 7 days ago.  Her preschool aged daughter is sick at home with similar symptoms. Cough is dry and worse at nighttime. Nasal congestion is thick.  Sore throat is worsened with swallowing. Headache is localized to the frontal aspect of the head and is currently a 2 on a scale of 0-10.  Denies vision changes and dizziness. No fever/chills, shortness of breath, chest pain, nausea, vomiting, abdominal pain, diarrhea, eye drainage, or body aches reported. Denies history of asthma or chronic respiratory problems, although she does state that she was recently diagnosed with diabetes and is concerned about possible pneumonia infection. Patient is not a smoker and denies drug use. Has attempted use of Robitussin and Alka-Seltzer plus prior to arrival at urgent care for relief of symptoms without much relief of symptoms.  Home COVID-19 test was negative yesterday.    Cough   Past Medical History:  Diagnosis Date   Medical history non-contributory    Vaginal Pap smear, abnormal     Patient Active Problem List   Diagnosis Date Noted   Status post primary low transverse cesarean section 06/21/2017   Labor and delivery, indication for care 06/18/2017    Past Surgical History:  Procedure Laterality Date   CESAREAN SECTION N/A 06/19/2017   Procedure: CESAREAN SECTION;  Surgeon: Osborne Oman, MD;  Location: [facility];  Service: Obstetrics;  Laterality: N/A;   NO PAST SURGERIES     WISDOM TOOTH EXTRACTION      OB History     Gravida  1   Para  1   Term  1   Preterm      AB      Living  1      SAB      IAB       Ectopic      Multiple  0   Live Births  1            Home Medications    Prior to Admission medications   Medication Sig Start Date End Date Taking? Authorizing Provider  benzonatate (TESSALON) 100 MG capsule Take 1 capsule (100 mg total) by mouth every 8 (eight) hours. 11/20/21  Yes Talbot Grumbling, FNP  Guaifenesin 1200 MG TB12 Take 1 tablet (1,200 mg total) by mouth in the morning and at bedtime. 11/20/21  Yes Talbot Grumbling, FNP  glipiZIDE (GLUCOTROL XL) 10 MG 24 hr tablet Take 1 tablet (10 mg total) by mouth daily with breakfast. 09/06/21   Kerin Perna, NP  metroNIDAZOLE (FLAGYL) 500 MG tablet Take 500 mg by mouth 2 (two) times daily. 09/02/21   [provider]  sitaGLIPtin-metformin (JANUMET) 50-1000 MG tablet Take 1 tablet by mouth 2 (two) times daily with a meal. 09/06/21   Kerin Perna, NP  sodium chloride (OCEAN) 0.65 % SOLN nasal spray Place 1 spray into both nostrils as needed for congestion. Patient not taking: Reported on 09/06/2019 06/20/19 02/19/20  Chase Picket, MD    Family History Family History  Problem Relation Age of Onset   Diabetes Mother    Diabetes  Father     Social History Social History   Tobacco Use   Smoking status: Former    Types: Cigars   Smokeless tobacco: Never   Tobacco comments:    quit prior to preg  Substance Use Topics   Alcohol use: Yes    Comment: occ   Drug use: Yes    Types: Marijuana    Comment: Aug 2018     Allergies   Patient has no known allergies.   Review of Systems Review of Systems  Respiratory:  Positive for cough.   Per HPI   Physical Exam Triage Vital Signs ED Triage Vitals  Enc Vitals Group     BP 11/20/21 0957 128/84     Pulse Rate 11/20/21 0957 82     Resp 11/20/21 0957 16     Temp 11/20/21 0957 98.2 F (36.8 C)     Temp Source 11/20/21 0957 Oral     SpO2 11/20/21 0957 99 %     Weight --      Height --      Head Circumference --      Peak Flow --       Pain Score 11/20/21 0956 0     Pain Loc --      Pain Edu? --      Excl. in GC? --    No data found.  Updated Vital Signs BP 128/84 (BP Location: Left Arm)   Pulse 82   Temp 98.2 F (36.8 C) (Oral)   Resp 16   SpO2 99%   Visual Acuity Right Eye Distance:   Left Eye Distance:   Bilateral Distance:    Right Eye Near:   Left Eye Near:    Bilateral Near:     Physical Exam Vitals and nursing note reviewed.  Constitutional:      Appearance: Normal appearance. She is not ill-appearing or toxic-appearing.  HENT:     Head: Normocephalic and atraumatic.     Right Ear: Hearing, tympanic membrane, ear canal and external ear normal.     Left Ear: Hearing, tympanic membrane, ear canal and external ear normal.     Nose: Congestion present.     Right Turbinates: Swollen.     Left Turbinates: Swollen.     Mouth/Throat:     Lips: Pink.     Mouth: Mucous membranes are moist.     Pharynx: Posterior oropharyngeal erythema present.     Comments: Small amount of clear postnasal drainage visualized to the posterior oropharynx.  Eyes:     General: Lids are normal. Vision grossly intact. Gaze aligned appropriately.        Right eye: No discharge.        Left eye: No discharge.     Extraocular Movements: Extraocular movements intact.     Conjunctiva/sclera: Conjunctivae normal.     Pupils: Pupils are equal, round, and reactive to light.  Cardiovascular:     Rate and Rhythm: Normal rate and regular rhythm.     Heart sounds: Normal heart sounds, S1 normal and S2 normal.  Pulmonary:     Effort: Pulmonary effort is normal. No respiratory distress.     Breath sounds: Normal breath sounds and air entry.     Comments: Clear to auscultation bilaterally without adventitious lung sounds heard. Musculoskeletal:     Cervical back: Neck supple.  Lymphadenopathy:     Cervical: No cervical adenopathy.  Skin:    General: Skin is warm and dry.  Capillary Refill: Capillary refill takes less  than 2 seconds.     Findings: No rash.  Neurological:     General: No focal deficit present.     Mental Status: She is alert and oriented to person, place, and time. Mental status is at baseline.     Cranial Nerves: No dysarthria or facial asymmetry.     Motor: No weakness.  Psychiatric:        Mood and Affect: Mood normal.        Speech: Speech normal.        Behavior: Behavior normal.        Thought Content: Thought content normal.        Judgment: Judgment normal.      UC Treatments / Results  Labs (all labs ordered are listed, but only abnormal results are displayed) Labs Reviewed - No data to display  EKG   Radiology No results found.  Procedures Procedures (including critical care time)  Medications Ordered in UC Medications - No data to display  Initial Impression / Assessment and Plan / UC Course  I have reviewed the triage vital signs and the nursing notes.  Pertinent labs & imaging results that were available during my care of the patient were reviewed by me and considered in my medical decision making (see chart for details).   Viral URI with cough Symptoms and physical exam consistent with a viral upper respiratory tract infection that will likely resolve with rest, fluids, and prescriptions for symptomatic relief. No indication for imaging today based on stable cardiopulmonary exam and hemodynamically stable vital signs.  No indication for viral testing today as this will not change treatment plan and patient is out of the quarantine window/treatment window for antiviral therapy.  She expresses agreement with this plan.  Guaifenesin and Tessalon Perles sent to pharmacy for symptomatic relief to be taken as prescribed.   May use ibuprofen/tylenol over the counter for body aches, fever/chills, and overall discomfort associated with viral illness. Nonpharmacologic interventions for symptom relief provided and after visit summary below.  Work note given.  Strict  ED/urgent care return precautions given.  Patient verbalizes understanding and agreement with plan.  Counseled patient regarding possible side effects and uses of all medications prescribed at today's visit.  Patient verbalizes understanding and agreement with plan.  All questions answered.  Patient discharged from urgent care in stable condition.       Final Clinical Impressions(s) / UC Diagnoses   Final diagnoses:  Viral URI with cough  Nasal congestion     Discharge Instructions      You have a viral upper respiratory infection.   Take guaifenesin 1200 mg every 12 hours to thin your mucous so that you can get it out of your body easier with coughing/blowing your nose. Drink plenty of water while taking this medication so that it works well in your body (at least 8 cups a day).   Take tessalon pearles every 8 hours as needed for cough.  You may take tylenol 1,000mg  and ibuprofen 600mg  every 6 hours with food as needed for fever/chills, sore throat, aches/pains, and inflammation associated with viral illness. Take this with food to avoid stomach upset.    You may do salt water and baking soda gargles every 4 hours as needed for your throat pain.  Please put 1 teaspoon of salt and 1/2 teaspoon of baking soda in 8 ounces of warm water then gargle and spit the water out. You may also  put 1 tablespoon of honey in warm water and drink this to soothe your throat.  Place a humidifier in your room at night to help decrease dry air that can irritate your airway and cause you to have a sore throat and cough.  Please try to eat a well-balanced diet while you are sick so that your body gets proper nutrition to heal.  If you develop any new or worsening symptoms, please return.  If your symptoms are severe, please go to the emergency room.  Follow-up with your primary care provider for further evaluation and management of your symptoms as well as ongoing wellness visits.  I hope you feel better!     ED Prescriptions     Medication Sig Dispense Auth. Provider   Guaifenesin 1200 MG TB12 Take 1 tablet (1,200 mg total) by mouth in the morning and at bedtime. 14 tablet Reita May M, FNP   benzonatate (TESSALON) 100 MG capsule Take 1 capsule (100 mg total) by mouth every 8 (eight) hours. 21 capsule Carlisle Beers, FNP      PDMP not reviewed this encounter.   Carlisle Beers, [place] 11/20/21 1040

## 2021-11-20 NOTE — ED Triage Notes (Signed)
Pt reports cough, nasal congestion and loss of taste and smell x 1 week. Has been taking Robitussin and Alka Seltzer Plus.

## 2021-12-03 ENCOUNTER — Other Ambulatory Visit: Payer: Self-pay

## 2021-12-03 ENCOUNTER — Encounter (HOSPITAL_COMMUNITY): Payer: Self-pay | Admitting: Family Medicine

## 2021-12-03 ENCOUNTER — Inpatient Hospital Stay (HOSPITAL_COMMUNITY)
Admission: AD | Admit: 2021-12-03 | Discharge: 2021-12-03 | Disposition: A | Discharge status: Home / Self Care | Payer: BC Managed Care – PPO | Attending: Obstetrics & Gynecology | Admitting: Obstetrics & Gynecology

## 2021-12-03 ENCOUNTER — Inpatient Hospital Stay (HOSPITAL_COMMUNITY): Payer: BC Managed Care – PPO

## 2021-12-03 DIAGNOSIS — Z3A01 Less than 8 weeks gestation of pregnancy: Secondary | ICD-10-CM

## 2021-12-03 DIAGNOSIS — O26891 Other specified pregnancy related conditions, first trimester: Secondary | ICD-10-CM | POA: Diagnosis present

## 2021-12-03 DIAGNOSIS — R109 Unspecified abdominal pain: Secondary | ICD-10-CM | POA: Diagnosis not present

## 2021-12-03 DIAGNOSIS — O3680X0 Pregnancy with inconclusive fetal viability, not applicable or unspecified: Secondary | ICD-10-CM | POA: Diagnosis not present

## 2021-12-03 LAB — CBC
HCT: 38.4 % (ref 36.0–46.0)
Hemoglobin: 12.5 g/dL (ref 12.0–15.0)
MCH: 28.5 pg (ref 26.0–34.0)
MCHC: 32.6 g/dL (ref 30.0–36.0)
MCV: 87.7 fL (ref 80.0–100.0)
Platelets: 298 10*3/uL (ref 150–400)
RBC: 4.38 MIL/uL (ref 3.87–5.11)
RDW: 14.5 % (ref 11.5–15.5)
WBC: 8.4 10*3/uL (ref 4.0–10.5)
nRBC: 0 % (ref 0.0–0.2)

## 2021-12-03 LAB — WET PREP, GENITAL
Clue Cells Wet Prep HPF POC: NONE SEEN
Sperm: NONE SEEN
Trich, Wet Prep: NONE SEEN
WBC, Wet Prep HPF POC: <10 (ref <10)
Yeast Wet Prep HPF POC: NONE SEEN

## 2021-12-03 LAB — POCT PREGNANCY, URINE: Preg Test, Ur: POSITIVE — AB

## 2021-12-03 LAB — URINALYSIS, ROUTINE W REFLEX MICROSCOPIC
Bilirubin Urine: NEGATIVE
Glucose, UA: NEGATIVE mg/dL
Hgb urine dipstick: NEGATIVE
Ketones, ur: NEGATIVE mg/dL
Leukocytes,Ua: NEGATIVE
Nitrite: NEGATIVE
Protein, ur: NEGATIVE mg/dL
Specific Gravity, Urine: 1.024 (ref 1.005–1.030)
pH: 5 (ref 5.0–8.0)

## 2021-12-03 LAB — HCG, QUANTITATIVE, PREGNANCY: hCG, Beta Chain, Quant, S: 132 m[IU]/mL — ABNORMAL HIGH (ref <5)

## 2021-12-03 LAB — ABO/RH: ABO/RH(D): A POS

## 2021-12-03 NOTE — MAU Note (Signed)
Yolanda Martinez is a 26 y.o. at [facility] here in [facility]U reporting: she took a HPT this morning and wants to find out how far along she is.  Denies VB, endorses menstrual type cramping. LMP: 11/02/21 Onset of complaint: today Pain score: 4 Vitals:   12/03/21 0851  BP: 134/79  Pulse: (!) 113  Resp: 18  Temp: 98.3 F (36.8 C)  SpO2: 100%     FHT:N/A Lab orders placed from triage:   UPT

## 2021-12-03 NOTE — MAU Provider Note (Signed)
History     CSN: [id number]  Arrival date and time: 12/03/21 0836   None     Chief Complaint  Patient presents with   Possible Pregnancy   [facility], a  26 y.o. G2P1001 at [redacted]w[redacted]d presents to MAU with complaints of abdominal cramping that started 3 days ago. Patient states she "thought" that her period was starting, but never came. Home UPT positive. Patient states she has very regular cycles every 28 days, and has a definite LMP of 11/02/21. Patient currently rates pain a 5/10, and denies attempting to relieve pain. Patient denies vaginal bleeding abnormal vaginal discharge.          OB History     Gravida  2   Para  1   Term  1   Preterm      AB      Living  1      SAB      IAB      Ectopic      Multiple  0   Live Births  1           Past Medical History:  Diagnosis Date   Medical history non-contributory    Vaginal Pap smear, abnormal     Past Surgical History:  Procedure Laterality Date   CESAREAN SECTION N/A 06/19/2017   Procedure: CESAREAN SECTION;  Surgeon: Tereso Newcomer, MD;  [facility]: [facility];  Service: Obstetrics;  Laterality: N/A;   NO PAST SURGERIES     WISDOM TOOTH EXTRACTION      Family History  Problem Relation Age of Onset   Diabetes Mother    Diabetes Father     Social History   Tobacco Use   Smoking status: Former    Types: Cigars   Smokeless tobacco: Never   Tobacco comments:    quit prior to preg  Substance Use Topics   Alcohol use: Yes    Comment: occ   Drug use: Yes    Types: Marijuana    Comment: Aug 2018    Allergies: No Known Allergies  Medications Prior to Admission  Medication Sig Dispense Refill Last Dose   benzonatate (TESSALON) 100 MG capsule Take 1 capsule (100 mg total) by mouth every 8 (eight) hours. 21 capsule 0    glipiZIDE (GLUCOTROL XL) 10 MG 24 hr tablet Take 1 tablet (10 mg total) by mouth daily with breakfast. 90 tablet 1    Guaifenesin 1200 MG TB12 Take 1 tablet  (1,200 mg total) by mouth in the morning and at bedtime. 14 tablet 0    metroNIDAZOLE (FLAGYL) 500 MG tablet Take 500 mg by mouth 2 (two) times daily.      sitaGLIPtin-metformin (JANUMET) 50-1000 MG tablet Take 1 tablet by mouth 2 (two) times daily with a meal. 60 tablet 1     Review of Systems  Constitutional:  Negative for chills, fatigue and fever.  Eyes:  Negative for pain and visual disturbance.  Respiratory:  Negative for apnea, shortness of breath and wheezing.   Cardiovascular:  Negative for chest pain and palpitations.  Gastrointestinal:  Positive for abdominal pain. Negative for constipation, diarrhea, nausea and vomiting.  Genitourinary:  Positive for pelvic pain. Negative for difficulty urinating, dysuria, vaginal bleeding, vaginal discharge and vaginal pain.  Musculoskeletal:  Negative for back pain.  Neurological:  Negative for seizures, weakness and headaches.  Psychiatric/Behavioral:  Negative for suicidal ideas.    Physical Exam   Blood pressure 134/79, pulse (!) 113, temperature 98.3  F (36.8 C), temperature source Oral, resp. rate 18, height 5\' 2"  (1.575 m), weight 94.2 kg, last menstrual period 11/02/2021, SpO2 100 %.  Physical Exam Vitals and nursing note reviewed.  Constitutional:      General: She is not in acute distress.    Appearance: Normal appearance.  HENT:     Head: Normocephalic.  Pulmonary:     Effort: Pulmonary effort is normal.  Abdominal:     Palpations: Abdomen is soft.  Musculoskeletal:        General: Normal range of motion.     Cervical back: Normal range of motion.  Skin:    General: Skin is warm and dry.     Capillary Refill: Capillary refill takes less than 2 seconds.  Neurological:     Mental Status: She is alert and oriented to person, place, and time.  Psychiatric:        Mood and Affect: Mood normal.     MAU Course  Procedures Orders Placed This Encounter  Procedures   Wet prep, genital   11/04/2021 OB LESS THAN 14 WEEKS WITH OB  TRANSVAGINAL   Urinalysis, Routine w reflex microscopic Urine, Clean Catch   CBC   hCG, quantitative, pregnancy   Diet NPO time specified   Pregnancy, urine POC   ABO/Rh   Results for orders placed or performed during the hospital encounter of 12/03/21 (from the past 24 hour(s))  Pregnancy, urine POC     Status: Abnormal   Collection Time: 12/03/21  8:55 AM  Result Value Ref Range   Preg Test, Ur POSITIVE (A) NEGATIVE  CBC     Status: None   Collection Time: 12/03/21  9:12 AM  Result Value Ref Range   WBC 8.4 4.0 - 10.5 K/uL   RBC 4.38 3.87 - 5.11 MIL/uL   Hemoglobin 12.5 12.0 - 15.0 g/dL   HCT 12/05/21 78.2 - 95.6 %   MCV 87.7 80.0 - 100.0 fL   MCH 28.5 26.0 - 34.0 pg   MCHC 32.6 30.0 - 36.0 g/dL   RDW 21.3 08.6 - 57.8 %   Platelets 298 150 - 400 K/uL   nRBC 0.0 0.0 - 0.2 %  ABO/Rh     Status: None   Collection Time: 12/03/21  9:12 AM  Result Value Ref Range   ABO/RH(D) A POS    No rh immune globuloin      NOT A RH IMMUNE GLOBULIN CANDIDATE, PT RH POSITIVE Performed at [facility], [address], [place], [place]   Wet prep, genital     Status: None   Collection Time: 12/03/21  9:17 AM   Specimen: PATH Cytology Cervicovaginal Ancillary Only  Result Value Ref Range   Yeast Wet Prep HPF POC NONE SEEN NONE SEEN   Trich, Wet Prep NONE SEEN NONE SEEN   Clue Cells Wet Prep HPF POC NONE SEEN NONE SEEN   WBC, Wet Prep HPF POC <10 <10   Sperm NONE SEEN    12/05/21 OB LESS THAN 14 WEEKS WITH OB TRANSVAGINAL  Result Date: 12/03/2021 CLINICAL DATA:  Abdominal cramping. EXAM: OBSTETRIC <14 WK 12/05/2021 AND TRANSVAGINAL OB US TECHNIQUE: Both transabdominal and transvaginal ultrasound examinations were performed for complete evaluation of the gestation as well as the maternal uterus, adnexal regions, and pelvic cul-de-sac. Transvaginal technique was performed to assess early pregnancy. COMPARISON:  None Available. FINDINGS: Intrauterine gestational sac: None Yolk sac:  Not  Visualized. Embryo:  Not Visualized. Cardiac Activity: Not Visualized.  Maternal uterus/adnexae: Probable corpus luteum cyst seen in right ovary. Left ovary is unremarkable. No free fluid is noted. IMPRESSION: No intrauterine gestational sac, yolk sac, fetal pole, or cardiac activity visualized. Differential considerations include intrauterine gestation too early to be sonographically visualized, spontaneous abortion, or ectopic pregnancy. Consider follow-up ultrasound in 14 days and serial quantitative beta HCG follow-up. Electronically Signed   By: Marijo Conception M.D.   On: 12/03/2021 09:41    MDM Wet Prep Neg  CBC normal: HBG 12.5 Platelet count 298 UA- PENDING  HCG 132  No IUP at this time.  Repeat quant in 48 hours  Viability [place] in 14 days.  Message sent to [facility]  Patient stable for discharge   Assessment and Plan   1. Pregnancy of unknown anatomic location   2. [redacted] weeks gestation of pregnancy   3. Abdominal cramping    - Discussed that at this time, may be too early to identify pregnancy. Also discussed the possibility of ectopic pregnancy.  - Strict bleeding and return precautions provided.  - Repeat quant scheduled at  - Message sent to [facility] for viability [place] - Patient discharged home in stable condition and may return to MAU as needed.     Jacquiline Doe, MSN CNM  12/03/2021, 9:20 AM

## 2021-12-04 LAB — GC/CHLAMYDIA PROBE AMP (~~LOC~~) NOT AT ARMC
Chlamydia: NEGATIVE
Comment: NEGATIVE
Comment: NORMAL
Neisseria Gonorrhea: NEGATIVE

## 2021-12-05 ENCOUNTER — Ambulatory Visit (INDEPENDENT_AMBULATORY_CARE_PROVIDER_SITE_OTHER): Payer: BC Managed Care – PPO

## 2021-12-05 ENCOUNTER — Other Ambulatory Visit: Payer: Self-pay

## 2021-12-05 VITALS — BP 133/75 | HR 97 | Wt 206.1 lb

## 2021-12-05 DIAGNOSIS — O3680X0 Pregnancy with inconclusive fetal viability, not applicable or unspecified: Secondary | ICD-10-CM

## 2021-12-05 LAB — BETA HCG QUANT (REF LAB): hCG Quant: 343 m[IU]/mL

## 2021-12-05 NOTE — Progress Notes (Signed)
Beta HCG Follow-up Visit  Yolanda Martinez presents to [facility] for follow-up beta HCG lab. She was seen in MAU for abdominal pain on 12/03/21. Patient reports nausea today but denies vaginal bleeding or abdominal pain. Discussed with patient that we are following beta HCG levels today. Results will be back in approximately 2 hours. Patient requests MyChart message with results.  Patient has history of DM type 2. Patient asks if she may continue current regimen that includes glipizide and sitagliptin-metformin. Patient will see PCP tomorrow and plans to ask about continuous glucose monitor. Nausea today, no meds  Beta HCG results: 12/03/21 09:12 132  12/05/21 11:10 343   Results and patient history reviewed with Manya Silvas, who states this appears to be viable pregnancy and patient should follow up for repeat US previously scheduled for 12/17/21. MyChart message sent to patient.  Annabell Howells 12/05/2021 8:05 AM

## 2021-12-06 ENCOUNTER — Ambulatory Visit (INDEPENDENT_AMBULATORY_CARE_PROVIDER_SITE_OTHER): Payer: BC Managed Care – PPO | Admitting: Primary Care

## 2021-12-06 VITALS — BP 117/77 | HR 74 | Resp 16 | Wt 208.8 lb

## 2021-12-06 DIAGNOSIS — E119 Type 2 diabetes mellitus without complications: Secondary | ICD-10-CM | POA: Diagnosis not present

## 2021-12-06 DIAGNOSIS — Z3490 Encounter for supervision of normal pregnancy, unspecified, unspecified trimester: Secondary | ICD-10-CM

## 2021-12-06 MED ORDER — INSULIN REGULAR HUMAN 100 UNIT/ML IJ SOLN: INTRAMUSCULAR | 11 refills | Status: DC

## 2021-12-06 MED ORDER — ONETOUCH VERIO FLEX SYSTEM W/DEVICE KIT: 1.0000 | PACK | Freq: Four times a day (QID) | 0 refills | Status: DC

## 2021-12-06 NOTE — Progress Notes (Unsigned)
Oxly, is a 26 y.o. female  VVZ:[id number]  JQG:[id number]  DOB - [birth year]  No chief complaint on file.      Subjective:   Yolanda Martinez is a 26 y.o. female here today to inform PCP that she was pregnant.  Patient was questioning what medications should she be taking and that she needs to continue the current ones.  Discontinue all oral diabetic medication and will start sliding scale insulin and sent to the pharmacy.  She has a appointment on December 17, 2021 for viability.  She will be referred to OB/GYN at this appointment.  Patient has No headache, No chest pain, No abdominal pain - No Nausea, No new weakness tingling or numbness, No Cough - shortness of breath  No problems updated.  No Known Allergies  Past Medical History:  Diagnosis Date   Medical history non-contributory    Vaginal Pap smear, abnormal     Current Outpatient Medications on File Prior to Visit  Medication Sig Dispense Refill   glipiZIDE (GLUCOTROL XL) 10 MG 24 hr tablet Take 1 tablet (10 mg total) by mouth daily with breakfast. 90 tablet 1   sitaGLIPtin-metformin (JANUMET) 50-1000 MG tablet Take 1 tablet by mouth 2 (two) times daily with a meal. 60 tablet 1   [DISCONTINUED] sodium chloride (OCEAN) 0.65 % SOLN nasal spray Place 1 spray into both nostrils as needed for congestion. (Patient not taking: Reported on 09/06/2019)  0   No current facility-administered medications on file prior to visit.    Objective:  There were no vitals filed for this visit.  Exam General appearance : Awake, alert, not in any distress. Speech Clear. Not toxic looking HEENT: Atraumatic and Normocephalic, pupils equally reactive to light and accomodation Neck: Supple, no JVD. No cervical lymphadenopathy.  Chest: Good air entry bilaterally, no added sounds  CVS: S1 S2 regular, no murmurs.  Abdomen: Bowel sounds present, Non tender and not distended with no gaurding, rigidity  or rebound. Extremities: B/L Lower Ext shows no edema, both legs are warm to touch Neurology: Awake alert, and oriented X 3, CN II-XII intact, Non focal Skin: No Rash  Data Review Lab Results  Component Value Date   HGBA1C 9.2 (A) 09/06/2021    Assessment & Plan  Diagnoses and all orders for this visit:  Type 2 diabetes mellitus without complication, with long-term current use of insulin (HCC) Discontinued all oral medication for diabetes placed on Humulin  sliding scale Blood Glucose Monitoring Suppl (ONETOUCH VERIO FLEX SYSTEM) w/Device KIt prescribed per patient request  placed a referral to OB/GYN  Other orders -     Discontinue: insulin regular (HUMULIN R) 100 units/mL injection; blood sugars 200 -250 give 4 units of insulin, 251-300 give 6 units, 301-350 give 8 units, 351-400 give 10 units,> 400 give 12 units and call M.D. Discussed hypoglycemia protocol. -     insulin regular (HUMULIN R) 100 units/mL injection; blood sugars 200 -250 give 4 units of insulin, 251-300 give 6 units, 301-350 give 8 units, 351-400 give 10 units,> 400 give 12 units and call M.D. Discussed hypoglycemia protocol.    There are no diagnoses linked to this encounter.   Patient have been counseled extensively about nutrition and exercise. Other issues discussed during this visit include: low cholesterol diet, weight control and daily exercise, foot care, annual eye examinations at Ophthalmology, importance of adherence with medications and regular follow-up. We also discussed long term complications of uncontrolled diabetes and hypertension.  No follow-ups on file.  The patient was given clear instructions to go to ER or return to medical center if symptoms don't improve, worsen or new problems develop. The patient verbalized understanding. The patient was told to call to get lab results if they haven't heard anything in the next week.   This note has been created with Designer, industrial/product. Any transcriptional errors are unintentional.   Kerin Perna, NP 12/06/2021, 11:43 AM

## 2021-12-09 ENCOUNTER — Ambulatory Visit (INDEPENDENT_AMBULATORY_CARE_PROVIDER_SITE_OTHER): Payer: Self-pay | Admitting: *Deleted

## 2021-12-09 ENCOUNTER — Other Ambulatory Visit: Payer: Self-pay | Admitting: Pharmacist

## 2021-12-09 MED ORDER — INSULIN REGULAR HUMAN 100 UNIT/ML IJ SOLN: INTRAMUSCULAR | 11 refills | Status: DC

## 2021-12-09 NOTE — Telephone Encounter (Signed)
Summary: med ?   [name] from [facility] called in , asking about insulin regular (HUMULIN R) 100 units/mL injection, usage. She says it has a sliding scale but doesn't say how often to use it.     Pharmacy needs  clarification on how many times a day the pt is to take the sliding scale. Please clarify.  [facility], [place] [address] (Pharmacy) [phone]

## 2021-12-09 NOTE — Telephone Encounter (Signed)
Luke would you be able to take care of this  

## 2021-12-11 ENCOUNTER — Other Ambulatory Visit: Payer: Self-pay | Admitting: General Practice

## 2021-12-11 DIAGNOSIS — O3680X0 Pregnancy with inconclusive fetal viability, not applicable or unspecified: Secondary | ICD-10-CM

## 2021-12-17 ENCOUNTER — Encounter: Payer: Self-pay | Admitting: Family Medicine

## 2021-12-17 ENCOUNTER — Encounter: Payer: Self-pay | Admitting: Advanced Practice Midwife

## 2021-12-17 ENCOUNTER — Other Ambulatory Visit: Payer: Self-pay | Admitting: General Practice

## 2021-12-17 ENCOUNTER — Ambulatory Visit (INDEPENDENT_AMBULATORY_CARE_PROVIDER_SITE_OTHER): Payer: BC Managed Care – PPO | Admitting: Advanced Practice Midwife

## 2021-12-17 ENCOUNTER — Ambulatory Visit (INDEPENDENT_AMBULATORY_CARE_PROVIDER_SITE_OTHER): Payer: BC Managed Care – PPO

## 2021-12-17 DIAGNOSIS — O24111 Pre-existing diabetes mellitus, type 2, in pregnancy, first trimester: Secondary | ICD-10-CM | POA: Diagnosis not present

## 2021-12-17 DIAGNOSIS — O09899 Supervision of other high risk pregnancies, unspecified trimester: Secondary | ICD-10-CM | POA: Diagnosis not present

## 2021-12-17 DIAGNOSIS — E119 Type 2 diabetes mellitus without complications: Secondary | ICD-10-CM | POA: Insufficient documentation

## 2021-12-17 DIAGNOSIS — O3680X0 Pregnancy with inconclusive fetal viability, not applicable or unspecified: Secondary | ICD-10-CM

## 2021-12-17 DIAGNOSIS — O24119 Pre-existing diabetes mellitus, type 2, in pregnancy, unspecified trimester: Secondary | ICD-10-CM

## 2021-12-17 DIAGNOSIS — Z3A01 Less than 8 weeks gestation of pregnancy: Secondary | ICD-10-CM | POA: Diagnosis not present

## 2021-12-17 HISTORY — DX: Pre-existing type 2 diabetes mellitus, in pregnancy, unspecified trimester: O24.119

## 2021-12-17 NOTE — Patient Instructions (Addendum)
Do not take your insulin until you have met with the Diabetes educator. I will prescribe new dosing.   Diet   3 meals, 3 snacks, avoidance of concentrated sweets and limiting simple carbs easily broken down to sugar (bread, rice, pasta, potatoes, corn). Watch for drinking carbs! Include protein with every meal or snack to decrease how quickly sugars are absorbed and spiking CBGs      Goals   Check CBG 4x/daily   Fasting < 95, 2 hour postprandial (after meals) < 120

## 2021-12-17 NOTE — Progress Notes (Addendum)
Ultrasounds Results Note  SUBJECTIVE HPI:  [name] is a 26 y.o. G2P1001 at 83w3dby LMP [facility]. The patient denies/reports abdominal pain or vaginal bleeding.  Upon review of the patient's records, patient was first seen in MAU on 12/03/21 for cramping.      Previous HCG's 12/03/21: 132 12/05/21: 343  Recent Dx Type 2 DM. States PCP took her off oral med and Rx'd sliding scale insulin but pt has never learned how to given herself insulin and it wasn't;t available at her pharmacy when she went to fill Rx. States PCP will not manage her DM during pregnancy. Has DM education appt 12/19/21.   Previous [place] LESS THAN 14 WEEKS WITH OB TRANSVAGINAL  Result Date: 12/03/2021 CLINICAL DATA:  Abdominal cramping. EXAM: OBSTETRIC <14 WK [place] TRANSVAGINAL OB [place]TECHNIQUE: Both transabdominal and transvaginal ultrasound examinations were performed for complete evaluation of the gestation as well as the maternal uterus, adnexal regions, and pelvic cul-de-sac. Transvaginal technique was performed to assess early pregnancy. COMPARISON:  None Available. FINDINGS: Intrauterine gestational sac: None Yolk sac:  Not Visualized. Embryo:  Not Visualized. Cardiac Activity: Not Visualized. Maternal uterus/adnexae: Probable corpus luteum cyst seen in right ovary. Left ovary is unremarkable. No free fluid is noted. IMPRESSION: No intrauterine gestational sac, yolk sac, fetal pole, or cardiac activity visualized. Differential considerations include intrauterine gestation too early to be sonographically visualized, spontaneous abortion, or ectopic pregnancy. Consider follow-up ultrasound in 14 days and serial quantitative beta HCG follow-up. Electronically Signed   By: Yolanda ConceptionM.D.   On: 12/03/2021 09:41     --/--/A POS (10/17 00175  Repeat ultrasound was performed earlier today.   Past Medical History:  Diagnosis Date   Medical  history non-contributory    Vaginal Pap smear, abnormal    Past Surgical History:  Procedure Laterality Date   CESAREAN SECTION N/A 06/19/2017   Procedure: CESAREAN SECTION;  Surgeon: AOsborne Oman MD;  [place]  Service: Obstetrics;  Laterality: N/A;   NO PAST SURGERIES     WISDOM TOOTH EXTRACTION     Social History   Socioeconomic History   Marital status: Married    Spouse name: Not on file   Number of children: Not on file   Years of education: Not on file   Highest education level: Not on file  Occupational History   Not on file  Tobacco Use   Smoking status: Former    Types: Cigars   Smokeless tobacco: Never   Tobacco comments:    quit prior to preg  Substance and Sexual Activity   Alcohol use: Yes    Comment: occ   Drug use: Yes    Types: Marijuana    Comment: Aug 2018   Sexual activity: Yes    Birth control/protection: None, Implant  Other Topics Concern   Not on file  Social History Narrative   Not on file   Social Determinants of Health   Financial Resource Strain: Not on file  Food Insecurity: Not on file  Transportation Needs: Not on file  Physical Activity: Not on file  Stress: Not on file  Social Connections: Not on file  Intimate Partner Violence: Not on file   Current Outpatient Medications on File Prior to Visit  Medication Sig Dispense Refill   Blood Glucose Monitoring Suppl ([place] w/Device KIT 1 Device by Does not apply route in the  morning, at noon, in the evening, and at bedtime. 1 kit 0   insulin regular (HUMULIN R) 100 units/mL injection Give up to TID before meals. If blood sugars 200 -250 give 4 units of insulin, 251-300 give 6 units, 301-350 give 8 units, 351-400 give 10 units,> 400 give 12 units and call M.D. Discussed hypoglycemia protocol. 10 mL 11   [DISCONTINUED] sodium chloride (OCEAN) 0.65 % SOLN nasal spray Place 1 spray into both nostrils as needed for congestion. (Patient not taking:  Reported on 09/06/2019)  0   No current facility-administered medications on file prior to visit.   No Known Allergies  I have reviewed patient's Past Medical Hx, Surgical Hx, Family Hx, Social Hx, medications and allergies.   Review of Systems Review of Systems  Constitutional: Negative for fever and chills.  Gastrointestinal: Negative for abdominal pain.  Genitourinary: Negative for vaginal bleeding.  Musculoskeletal: Negative for back pain.  Neurological: Negative for dizziness and weakness.    Physical Exam  LMP 11/02/2021   Patient's last menstrual period was 11/02/2021. GENERAL: Well-developed, well-nourished female in no acute distress.  HEENT: Normocephalic, atraumatic.   LUNGS: Effort normal ABDOMEN: Deferred HEART: Regular rate  SKIN: Warm, dry and without erythema PSYCH: Normal mood and affect NEURO: Alert and oriented x 4   IMAGING US OB Transvaginal  Result Date: 12/17/2021 CLINICAL DATA:  26 yo G2P1001 at 98w3dby LMP 11/02/21. Exam: OBSTETRIC <14 WK [place]and TRANSVAGINAL OB [place]oreaTechnique:  Transvaginal ultrasound examination was performed for complete evaluation of the gestation as well at the maternal uterus, adnexal regions, and pelvic cul-de-sac.  Transvaginal technique was performed to assess early pregnancy. Comparison:  12/03/2021 Findings: SNelda MarseilleIUP noted Yolk sac: visualized Embryo: visualized Cardiac activity: visualized, FHR 96 CRL: 547m USKoreaDC:  08/11/2022 Cervix: long, closed Adnexa: normal bilaterally Subchorionic hemorrhage:  None Other findings:  None Impression: [facility] live [facility] by L[facility] confirmed by ultrasound today. Recommendations: Follow up as clinically indicated. PauRadene GunningD    ASSESSMENT 1. [redacted] weeks gestation of pregnancy   2. Type 2 diabetes mellitus without complication, without long-term current use of insulin ([place] 3. Pregnancy with type 2 diabetes mellitus in first trimester     PLAN F/U DM education  12/19/21.  Start prenatal care Suggest asking pharmacy to see if insulin is in stock at other locations and transfer Rx. Is welcome to MyChart message or call our office to call it into another pharmacy if needed.  Patient advised to start/continue taking prenatal vitamins Go to MAU as needed for heavy bleeding, abdominal pain or fever greater than 100.4.  VirOak RidgeNM 12/17/2021 9:12 AM

## 2021-12-19 ENCOUNTER — Encounter: Payer: BC Managed Care – PPO | Attending: Primary Care | Admitting: Registered"

## 2021-12-19 ENCOUNTER — Other Ambulatory Visit: Payer: Self-pay | Admitting: Pharmacist

## 2021-12-19 ENCOUNTER — Other Ambulatory Visit: Payer: Self-pay

## 2021-12-19 ENCOUNTER — Ambulatory Visit (INDEPENDENT_AMBULATORY_CARE_PROVIDER_SITE_OTHER): Payer: BC Managed Care – PPO | Admitting: Registered"

## 2021-12-19 DIAGNOSIS — Z713 Dietary counseling and surveillance: Secondary | ICD-10-CM | POA: Diagnosis not present

## 2021-12-19 DIAGNOSIS — Z3A10 10 weeks gestation of pregnancy: Secondary | ICD-10-CM | POA: Diagnosis not present

## 2021-12-19 DIAGNOSIS — O24111 Pre-existing diabetes mellitus, type 2, in pregnancy, first trimester: Secondary | ICD-10-CM | POA: Insufficient documentation

## 2021-12-19 MED ORDER — METFORMIN HCL 500 MG PO TABS: 500.0000 mg | ORAL_TABLET | Freq: Two times a day (BID) | ORAL | Status: DC

## 2021-12-19 NOTE — Progress Notes (Signed)
Patient seen by diabetes educator today for T2DM in pregnancy. She is currently not taking any medications and inconsistently obtaining blood glucose readings with her glucometer. Appears random blood sugar readings range from 75-283 mg/dL. Given wide variability in glucose readings, will recommend starting metformin 500mg  BID with meals and counsel patient to obtain more consistent readings of blood sugar levels with both fasting and postprandial levels. She will follow-up with diabetes educator next week and will likely need to start insulin at that time once a more accurate blood glucose log can be obtained.   Titus Dubin, PharmD spoke with patient via telephone to counsel patient on metformin including directions, side effects, and pregnancy safety. All patient questions were answered. Patient is agreeable to plan.  Thank you for allowing pharmacy to be a part of this patient's care.  Lolita Rieger, PharmD, BCPPS

## 2021-12-19 NOTE — Progress Notes (Signed)
Patient was seen for T2 Diabetes in pregnancy self-management on 12/19/2021  Start time 1018 and End time 1137   Estimated due date: 08/09/2022; [redacted]w[redacted]d  New ob intake 01/07/22; first MD visit 01/21/22 Last PCP visit with Juluis Mire on 12/09/21  Clinical: Medications: Humulin R tid sliding scale for BG >200 Rx'd by PCP on 12/09/21. pt not taking, pharmacy out of stock Medical History: T2 diagnosed July 2023 Labs: OGTT n/a, A1c 9.2% 09/06/21  Dietary and Lifestyle History: Pt states she started Janumet and glipizide prior to pregnancy, but RX switched to regular insulin. Pt states she has not had insulin instruction and does not have medication due to pharmacy out of stock. It appears her instructions would mean checking blood sugar 6x/day. If that is the case would need to change Rx dosing or start her on CGM.  Pt reports she works 2 jobs and is trying to work out getting on to be just on weekends.  Pt states she usually gets up around 6 am to get her daughter ready for school and goes back to sleep until 7:30.  Pt states she has been checking blood sugar 2-3 hours after finishing meals, sometimes checks before meals. Going through log on her phone I didn't see fasting blood sugar.   Without medication her PPBG and random blood sugar readings are 75 - 283 mg/dL, mostly about 150 mg/dL 2-3 hours after meals.  Physical Activity: not assessed Stress: not assessed Sleep: not assessed  24 hr Recall: not assessed First Meal:  Snack: Second meal: Snack: Third meal: Snack: Beverages:  NUTRITION INTERVENTION  Nutrition education (E-1) on the following topics:   Initial Follow-up  [x]  []  Definition of Gestational Diabetes / T2D in pregnancy []  []  Why dietary management is important in controlling blood glucose [x]  []  Effects each nutrient has on blood glucose levels []  []  Simple carbohydrates vs complex carbohydrates []  []  Fluid intake [x]  []  Creating a balanced meal  plan []  []  Carbohydrate counting  []  []  When to check blood glucose levels []  []  Proper blood glucose monitoring techniques []  []  Effect of stress and stress reduction techniques  []  []  Exercise effect on blood glucose levels, appropriate exercise during pregnancy []  []  Importance of limiting caffeine and abstaining from alcohol and smoking [x]  []  Medications used for blood sugar control during pregnancy []  []  Hypoglycemia and rule of 15 []  []  Postpartum self care  Patient has a meter prior to visit. Patient is not testing as recommended, was unclear from her first instructions.   Patient was instructed to wait for MyChart message before going back to pharmacy. Will get further instructions from [place] provider and contact patient.  Pt also made follow-up appt for next week and will return with blood sugar log.  Patient instructed to monitor glucose levels: FBS: 60 - ? 95 mg/dL (some clinics use 90 for cutoff) 1 hour: ? 140 mg/dL 2 hour: ? 120 mg/dL  Patient received handouts: Nutrition Diabetes and Pregnancy Carbohydrate Counting List  Patient will be seen for follow-up as needed.

## 2021-12-22 DIAGNOSIS — O09899 Supervision of other high risk pregnancies, unspecified trimester: Secondary | ICD-10-CM | POA: Insufficient documentation

## 2021-12-22 HISTORY — DX: Supervision of other high risk pregnancies, unspecified trimester: O09.899

## 2021-12-25 ENCOUNTER — Other Ambulatory Visit (INDEPENDENT_AMBULATORY_CARE_PROVIDER_SITE_OTHER): Payer: Self-pay | Admitting: Primary Care

## 2021-12-25 ENCOUNTER — Telehealth: Payer: Self-pay | Admitting: Family Medicine

## 2021-12-25 DIAGNOSIS — O24111 Pre-existing diabetes mellitus, type 2, in pregnancy, first trimester: Secondary | ICD-10-CM

## 2021-12-25 NOTE — Telephone Encounter (Signed)
Patient called in saying her metformin prescription needed to be sent to the [facility] on [address] but they are saying they do not have it.She is experiencing issues with her blood pressure so she is concerned about waiting longer for it.

## 2021-12-25 NOTE — Telephone Encounter (Signed)
Need order for test strips

## 2021-12-26 ENCOUNTER — Ambulatory Visit (INDEPENDENT_AMBULATORY_CARE_PROVIDER_SITE_OTHER): Payer: BC Managed Care – PPO | Admitting: Registered"

## 2021-12-26 ENCOUNTER — Other Ambulatory Visit: Payer: Self-pay

## 2021-12-26 ENCOUNTER — Encounter: Payer: BC Managed Care – PPO | Attending: Primary Care | Admitting: Registered"

## 2021-12-26 DIAGNOSIS — Z7984 Long term (current) use of oral hypoglycemic drugs: Secondary | ICD-10-CM | POA: Insufficient documentation

## 2021-12-26 DIAGNOSIS — O24111 Pre-existing diabetes mellitus, type 2, in pregnancy, first trimester: Secondary | ICD-10-CM

## 2021-12-26 DIAGNOSIS — E119 Type 2 diabetes mellitus without complications: Secondary | ICD-10-CM | POA: Insufficient documentation

## 2021-12-26 DIAGNOSIS — Z794 Long term (current) use of insulin: Secondary | ICD-10-CM | POA: Diagnosis not present

## 2021-12-26 MED ORDER — METFORMIN HCL 500 MG PO TABS: 500.0000 mg | ORAL_TABLET | Freq: Two times a day (BID) | ORAL | 0 refills | Status: DC

## 2021-12-26 NOTE — Telephone Encounter (Signed)
Reordered metformin Rx- per chart review, med was ordered as "in house". Called and informed patient of prescription sent to pharmacy and reviewed upcoming appt. Patient asked about the purpose of the intake appt and timing of new OB appt. Reviewed with patient purpose of intake appt and that most new OB appts occur between 10-12 weeks. Patient verbalized understanding & had no other questions.

## 2021-12-26 NOTE — Progress Notes (Signed)
Patient was seen for follow-up T2 Diabetes in pregnancy self-management on 12/26/2021  Start time 1430 and End time 1500  Estimated due date: 08/09/2022; [redacted]w[redacted]d New ob intake 01/07/22; first MD visit 01/21/22 Last PCP visit with MJuluis Mireon 12/09/21  Clinical: Medications: Metformin 500 bid, Pt states Rx would be ready at 4 today. Humulin R tid sliding scale (Rx PCP on 12/09/21). pt not taking. Medical History: T2 diagnosed July 2023 Labs: OGTT n/a, A1c 9.2% 09/06/21  SMBG: last 3 days FBS: 117-128 mg/dL Breakfast: 86, 148, 154  Lunch: 161, 234, 126  Dinner: 190, 115, 134  Dietary and Lifestyle History: Pt reports she usually eats cereal for breakfast. Pt reports meals that have caused the highest blood sugar are (234) burger, fries, kool aid, (190) spaghetti and garlic bread. Meal that was the best blood sugar was baked pork chops, small portion mac & cheese.  Pt states there was problem with getting the metformin filled but the problem was resolved and she will start taking medication tonight.  Physical Activity: not assessed Stress: not assessed Sleep: not assessed  24 hr Recall:  First Meal: cereal Snack: Second meal: mcdonald burger, fries Snack: Third meal: spaghetti, garlic bread Snack: Beverages: water, diet soda, koolaid, starbuck frappachino (once)  NUTRITION INTERVENTION  Nutrition education (E-1) on the following topics:   Initial Follow-up  _0  _1  Definition of Gestational Diabetes / T2D in pregnancy _2  _3  Why dietary management is important in controlling blood glucose _4  _5  Effects each nutrient has on blood glucose levels _6  _7  Simple carbohydrates vs complex carbohydrates _8  _9  Fluid intake _10  _11  Creating a balanced meal plan _12  _13  Carbohydrate counting  _14  _15  When to check blood glucose levels _16  _17  Proper blood glucose monitoring techniques _18  _19  Effect of stress and stress reduction techniques  _20  _21  Exercise effect on blood  glucose levels, appropriate exercise during pregnancy _22  _23  Importance of limiting caffeine and abstaining from alcohol and smoking _24  _25  Medications used for blood sugar control during pregnancy _26  _27  Hypoglycemia and rule of 15 _28  _29  Postpartum self care  Plan: Start metformin tonight Start having more protein for breakfast Eat smaller portions of carbohydrates, include protein with meals and snacks. Document meals that are better for your blood sugar and try to stick to those meals Upload your blood sugar log to MyChart next Thurs morning for review to see if your metformin dose needs to be increased.  Patient instructed to monitor glucose levels: FBS: 60 - ? 95 mg/dL (some clinics use 90 for cutoff) 1 hour: ? 140 mg/dL 2 hour: ? 120 mg/dL  Patient received handouts: Nutrition Diabetes and Pregnancy Carbohydrate Counting List  Patient will be seen for follow-up as needed.

## 2022-01-07 ENCOUNTER — Encounter: Payer: Self-pay | Admitting: Registered"

## 2022-01-07 ENCOUNTER — Telehealth (INDEPENDENT_AMBULATORY_CARE_PROVIDER_SITE_OTHER): Payer: BC Managed Care – PPO

## 2022-01-07 DIAGNOSIS — O09899 Supervision of other high risk pregnancies, unspecified trimester: Secondary | ICD-10-CM

## 2022-01-07 NOTE — Progress Notes (Signed)
Patient uploaded her blood sugar log. Pt has first Ob visit with MD on 01/21/22 A1c 9.2% on 09/06/21 Pt is not currently using insulin, started metformin 500 mg bid 12/27/21 Dr. Crissie Reese reviewed information and will increase metformin to 1000 mg bid

## 2022-01-07 NOTE — Progress Notes (Signed)
New OB Intake  I connected withNAME@ on 01/07/22 at 10:15 AM EST by MyChart Video Visit and verified that I am speaking with the correct person using two identifiers. Nurse is located at [facility] and pt is located at [facility].  I discussed the limitations, risks, security and privacy concerns of performing an evaluation and management service by telephone and the availability of in person appointments. I also discussed with the patient that there may be a patient responsible charge related to this service. The patient expressed understanding and agreed to proceed.  I explained I am completing New OB Intake today. We discussed EDD of 08/09/22 that is based on LMP of 11/02/21. Pt is G2/P1. I reviewed her allergies, medications, Medical/Surgical/OB history, and appropriate screenings. I informed her of [facility] services. [facility] information placed in AVS. Based on history, this is a high risk pregnancy.  Patient Active Problem List   Diagnosis Date Noted   Supervision of other high risk pregnancy, antepartum 12/22/2021   Type 2 diabetes mellitus (HCC) 12/17/2021   Type 2 diabetes mellitus during pregnancy 12/17/2021   History of cesarean section 06/21/2017    Concerns addressed today  Delivery Plans Plans to deliver at [facility]. Patient given information for [facility] website for more information [facility]. Patient is not interested in water birth. Offered upcoming OB visit with CNM to discuss further.  MyChart/Babyscripts MyChart access verified. I explained pt will have some visits in office and some virtually. Babyscripts instructions given and order placed. Patient verifies receipt of registration text/e-mail. Account successfully created and app downloaded.  Blood Pressure Cuff/Weight Scale Patient has private insurance; instructed to purchase blood pressure cuff and bring to first prenatal appt. Explained after first prenatal appt pt will check weekly and document  in Babyscripts. Patient does have weight scale.  Anatomy US Explained first scheduled [place] will be around 19 weeks. Anatomy US scheduled for 03/17/22 at 0945a. Pt notified to arrive at 0930a.  Labs Discussed Avelina Laine genetic screening with patient. Would like both Panorama and Horizon drawn at new OB visit. Routine prenatal labs needed.  COVID Vaccine Patient has had COVID vaccine.   Is patient a CenteringPregnancy candidate?  Not a candidate Declined due to  NA Not a candidate due to DM If accepted,    Is patient a Mom+Baby Combined Care candidate?  Not a candidate   If accepted, Mom+Baby staff notified  Social Determinants of Health Food Insecurity: Patient denies food insecurity. WIC Referral: Patient is interested in referral to [facility].  Transportation: Patient denies transportation needs. Childcare: Discussed no children allowed at ultrasound appointments. Offered childcare services; patient declines childcare services at this time.  First visit review I reviewed new OB appt with patient. I explained they will have a provider visit that includes . Explained pt will be seen by Dr. Crissie Reese at first visit; encounter routed to appropriate provider. Explained that patient will be seen by pregnancy navigator following visit with provider.   Henrietta Dine, CMA 01/07/2022  10:11 AM

## 2022-01-14 ENCOUNTER — Ambulatory Visit (INDEPENDENT_AMBULATORY_CARE_PROVIDER_SITE_OTHER): Payer: BC Managed Care – PPO | Admitting: Registered"

## 2022-01-14 ENCOUNTER — Encounter: Payer: BC Managed Care – PPO | Attending: Primary Care | Admitting: Registered"

## 2022-01-14 ENCOUNTER — Other Ambulatory Visit: Payer: Self-pay

## 2022-01-14 DIAGNOSIS — E119 Type 2 diabetes mellitus without complications: Secondary | ICD-10-CM | POA: Diagnosis not present

## 2022-01-14 DIAGNOSIS — O09891 Supervision of other high risk pregnancies, first trimester: Secondary | ICD-10-CM | POA: Diagnosis present

## 2022-01-14 DIAGNOSIS — O24111 Pre-existing diabetes mellitus, type 2, in pregnancy, first trimester: Secondary | ICD-10-CM

## 2022-01-14 DIAGNOSIS — Z3A00 Weeks of gestation of pregnancy not specified: Secondary | ICD-10-CM | POA: Insufficient documentation

## 2022-01-14 NOTE — Progress Notes (Signed)
Patient was seen for follow-up T2 Diabetes in pregnancy self-management on 01/14/2022  Start time 0916 and End time 0948  Estimated due date: 08/09/2022; [redacted]w[redacted]d  New ob intake 01/07/22; first MD visit 01/21/22 Last PCP visit with Gwinda Passe on 12/09/21  Clinical: Medications: Metformin 500 bid started on 12/27/21 Humulin R tid sliding scale (Rx PCP on 12/09/21 pt not taking) Medical History: T2 diagnosed July 2023 Labs: OGTT n/a, A1c 9.2% 09/06/21    SMBG: last 10 days FBS: 85-117  Breakfast: 87-164  Lunch: 85-187  Dinner: 97-126  Dietary and Lifestyle History: Pt states she doesn't have much of an appetite and sometimes will just have a piece of fruit when isn't hungry for a meal.  Pt states she has not had a lot of energy, doesn't feel like she has the energe to get out of bed sometimes, but wants to get up and be active with daughter.  Pt states she would like to try some diet and exercise changes to see if will bring blood sugar to normal without increasing medication.  Physical Activity: ADL Stress: not assessed Sleep: not assessed  24 hr Recall:  First Meal: cereal (Fruity Pebbles) Snack: Second meal: skipped Snack: Third meal: small portions of Thanksgiving leftovers, 6 oz apple juice, water Snack: Beverages: 1 c water, soda, 2-3 juice/day   NUTRITION INTERVENTION  Nutrition education (E-1) on the following topics:   Initial Follow-up  [x]  []  Definition of Gestational Diabetes / T2D in pregnancy []  []  Why dietary management is important in controlling blood glucose [x]  []  Effects each nutrient has on blood glucose levels []  [x]  Simple carbohydrates vs complex carbohydrates []  []  [x] Fluid intake [x]  []  [x] Creating a balanced snack []  []  Carbohydrate counting  []  [x]  When to check blood glucose levels []  [x]  Proper blood glucose monitoring techniques []  []  Effect of stress and stress reduction techniques  []  [x]  [x] Exercise effect on blood glucose  levels, appropriate exercise during pregnancy []  [x]  Importance of limiting caffeine and abstaining from alcohol and smoking [x]  [x]  [x] Medications used for blood sugar control during pregnancy []  []  Hypoglycemia and rule of 15 []  [x]  Postpartum self care  Plan: Increase water intake, no juice or soda 15 min exercise in the evening sometime after dinner Balanced snacks such as protein bar, protein drink or ~15 g fruit + protein  Patient instructed to monitor glucose levels: FBS: 60 - ? 95 mg/dL; PPBG 2-hour: ? mg/dL  Patient received handouts: glucose log sheet  Patient will be seen for follow-up as needed.

## 2022-01-14 NOTE — Patient Instructions (Signed)
Increase water intake, no juice or soda 15 min exercise in the evening sometime after dinner Balanced snacks such as protein bar, protein drink or ~15 g fruit + protein

## 2022-01-21 ENCOUNTER — Encounter: Payer: Self-pay | Admitting: Family Medicine

## 2022-01-21 ENCOUNTER — Ambulatory Visit (INDEPENDENT_AMBULATORY_CARE_PROVIDER_SITE_OTHER): Payer: BC Managed Care – PPO | Admitting: Family Medicine

## 2022-01-21 ENCOUNTER — Other Ambulatory Visit: Payer: Self-pay

## 2022-01-21 ENCOUNTER — Other Ambulatory Visit (HOSPITAL_COMMUNITY)
Admission: RE | Admit: 2022-01-21 | Discharge: 2022-01-21 | Disposition: A | Discharge status: Home / Self Care | Payer: BC Managed Care – PPO | Source: Ambulatory Visit | Attending: Family Medicine | Admitting: Family Medicine

## 2022-01-21 VITALS — BP 127/80 | HR 98 | Wt 208.3 lb

## 2022-01-21 DIAGNOSIS — O09891 Supervision of other high risk pregnancies, first trimester: Secondary | ICD-10-CM

## 2022-01-21 DIAGNOSIS — Z113 Encounter for screening for infections with a predominantly sexual mode of transmission: Secondary | ICD-10-CM | POA: Insufficient documentation

## 2022-01-21 DIAGNOSIS — O24111 Pre-existing diabetes mellitus, type 2, in pregnancy, first trimester: Secondary | ICD-10-CM

## 2022-01-21 DIAGNOSIS — Z124 Encounter for screening for malignant neoplasm of cervix: Secondary | ICD-10-CM | POA: Diagnosis present

## 2022-01-21 DIAGNOSIS — Z23 Encounter for immunization: Secondary | ICD-10-CM | POA: Diagnosis not present

## 2022-01-21 DIAGNOSIS — Z1332 Encounter for screening for maternal depression: Secondary | ICD-10-CM

## 2022-01-21 DIAGNOSIS — Z3A11 11 weeks gestation of pregnancy: Secondary | ICD-10-CM | POA: Diagnosis not present

## 2022-01-21 DIAGNOSIS — Z98891 History of uterine scar from previous surgery: Secondary | ICD-10-CM

## 2022-01-21 DIAGNOSIS — O09899 Supervision of other high risk pregnancies, unspecified trimester: Secondary | ICD-10-CM

## 2022-01-21 MED ORDER — ASPIRIN 81 MG PO TBEC: 81.0000 mg | DELAYED_RELEASE_TABLET | Freq: Every day | ORAL | 11 refills | Status: DC

## 2022-01-21 NOTE — Progress Notes (Signed)
Subjective:   Yolanda Martinez is a 26 y.o. G2P1001 at 79w3dby LMP, early ultrasound being seen today for her first obstetrical visit.  Her obstetrical history is significant for  T2DM, obesity, hx of one prior cesarean . Patient does not intend to breast feed. Pregnancy history fully reviewed.  Patient reports no complaints.  HISTORY: OB History  Gravida Para Term Preterm AB Living  _0 0 0 1  SAB IAB Ectopic Multiple Live Births  0 0 0 0 1    # Outcome Date GA Lbr Len/2nd Weight Sex Delivery Anes PTL Lv  2 Current           1 Term 06/19/17 485w2d7 lb 3.9 oz (3.286 kg) F CS-LTranv EPI  LIV     Name: WATSON,GIRL Kursten     Apgar1: 8  Apgar5: 9     Last pap smear: No results found for: "DIAGPAP", "HPV", "[place]*needs*  Past Medical History:  Diagnosis Date   Medical history non-contributory    Type 2 diabetes mellitus ([place]   Vaginal Pap smear, abnormal    Past Surgical History:  Procedure Laterality Date   CESAREAN SECTION N/A 06/19/2017   Procedure: CESAREAN SECTION;  Surgeon: AnOsborne OmanMD;  [facility] Service: Ob[facility]etrics;  Laterality: N/A;   NO PAST SURGERIES     WISDOM TOOTH EXTRACTION     Family History  Problem Relation Age of Onset   Diabetes Mother    Diabetes Father    Social History   Tobacco Use   Smoking status: Former    Types: Cigars   Smokeless tobacco: Never   Tobacco comments:    quit prior to preg  Substance Use Topics   Alcohol use: Yes    Comment: occ   Drug use: Yes    Types: Marijuana    Comment: Aug 2018   No Known Allergies Current Outpatient Medications on File Prior to Visit  Medication Sig Dispense Refill   Blood Glucose Monitoring Suppl (ONETOUCH VERIO FLEX SYSTEM) w/Device KIT 1 Device by Does not apply route in the morning, at noon, in the evening, and at bedtime. 1 kit 0   glucose blood (ONETOUCH VERIO) test strip CHECK BLOOD SUGAR EVERY MORNING, AT NOON, EVERY EVENING, AND AT  BEDTIME 100 strip 0   metFORMIN (GLUCOPHAGE) 500 MG tablet Take 1 tablet (500 mg total) by mouth 2 (two) times daily with a meal. 60 tablet 0   Prenatal Vit-Fe Fumarate-FA (MULTIVITAMIN-PRENATAL) 27-0.8 MG TABS tablet Take 1 tablet by mouth daily at 12 noon.     [DISCONTINUED] sodium chloride (OCEAN) 0.65 % SOLN nasal spray Place 1 spray into both nostrils as needed for congestion. (Patient not taking: Reported on 09/06/2019)  0   No current facility-administered medications on file prior to visit.     Exam   Vitals:   01/21/22 0833  BP: 127/80  Pulse: 98  Weight: 208 lb 4.8 oz (94.5 kg)   Fetal Heart Rate (bpm): 161 (scanned by Dr. EcDione Plover Uterus:     Pelvic Exam: Perineum: no hemorrhoids, normal perineum   Vulva: normal external genitalia, no lesions   Vagina:  normal mucosa mild amount of white discharge but denies any symptoms   Cervix: no lesions and normal, pap smear done.   System: General: well-developed, well-nourished female in no acute distress   Skin: normal coloration and turgor, no rashes   Neurologic: oriented, normal, negative, normal mood  Extremities: normal strength, tone, and muscle mass, ROM of all joints is normal   HEENT PERRLA, extraocular movement intact and sclera clear, anicteric   Neck supple and no masses   Respiratory:  no respiratory distress      Assessment:   Pregnancy: G2P1001 Patient Active Problem List   Diagnosis Date Noted   Supervision of other high risk pregnancy, antepartum 12/22/2021   Type 2 diabetes mellitus ([place]) 12/17/2021   Type 2 diabetes mellitus during pregnancy 12/17/2021   History of cesarean section 06/21/2017     Plan:  1. Supervision of other high risk pregnancy, antepartum BP and FHR normal Pap due, collected today Accepts flu shot Is a candidate for baby aspirin, rx sent and instructed to start next week Initial labs drawn. Continue prenatal vitamins. Genetic Screening discussed, NIPS: ordered. Ultrasound  discussed; fetal anatomic survey: ordered. Problem list reviewed and updated. The nature of [place] with multiple MDs and other Advanced Practice Providers was explained to patient; also emphasized that residents, students are part of our team.  2. Pregnancy with type 2 diabetes mellitus in first trimester Currently checking QID and taking metformin 548m BID Does not have pump or CGM, will plan to get CGM at least and then consider pump as her gestational age advances Start prenatal ASA next week, rx sent and discussed with patient Baseline CMP/UPCR collected today Discussed increased monitoring required during pregnancy c/b T2DM as well as additional imaging needed for baby including fetal echo, to be scheduled at future date  3. History of cesarean section Primary LTCS for NRFHT/fetal intolerance of labor Op note reviewed, was unremarkable [facility] so as baby was direct OP per op note   Routine obstetric precautions reviewed. Return in about 4 weeks (around 02/18/2022) [facility], needs MD.

## 2022-01-22 ENCOUNTER — Encounter: Payer: Self-pay | Admitting: *Deleted

## 2022-01-22 LAB — CBC/D/PLT+RPR+RH+ABO+RUBIGG...
Antibody Screen: NEGATIVE
Basophils Absolute: 0 10*3/uL (ref 0.0–0.2)
Basos: 0 %
EOS (ABSOLUTE): 0 10*3/uL (ref 0.0–0.4)
Eos: 0 %
HCV Ab: NONREACTIVE
HIV Screen 4th Generation wRfx: NONREACTIVE
Hematocrit: 37.5 % (ref 34.0–46.6)
Hemoglobin: 11.9 g/dL (ref 11.1–15.9)
Hepatitis B Surface Ag: NEGATIVE
Immature Grans (Abs): 0 10*3/uL (ref 0.0–0.1)
Immature Granulocytes: 0 %
Lymphocytes Absolute: 2.5 10*3/uL (ref 0.7–3.1)
Lymphs: 32 %
MCH: 27.6 pg (ref 26.6–33.0)
MCHC: 31.7 g/dL (ref 31.5–35.7)
MCV: 87 fL (ref 79–97)
Monocytes Absolute: 0.3 10*3/uL (ref 0.1–0.9)
Monocytes: 4 %
Neutrophils Absolute: 5 10*3/uL (ref 1.4–7.0)
Neutrophils: 64 %
Platelets: 253 10*3/uL (ref 150–450)
RBC: 4.31 x10E6/uL (ref 3.77–5.28)
RDW: 14.1 % (ref 11.7–15.4)
RPR Ser Ql: NONREACTIVE
Rh Factor: POSITIVE
Rubella Antibodies, IGG: 1.74 index (ref >0.99)
WBC: 7.9 10*3/uL (ref 3.4–10.8)

## 2022-01-22 LAB — COMPREHENSIVE METABOLIC PANEL
ALT: 16 IU/L (ref 0–32)
AST: 17 IU/L (ref 0–40)
Albumin/Globulin Ratio: 1.4 (ref 1.2–2.2)
Albumin: 4 g/dL (ref 4.0–5.0)
Alkaline Phosphatase: 56 IU/L (ref 44–121)
BUN/Creatinine Ratio: 20 (ref 9–23)
BUN: 10 mg/dL (ref 6–20)
Bilirubin Total: <0.2 mg/dL (ref 0.0–1.2)
CO2: 17 mmol/L — ABNORMAL LOW (ref 20–29)
Calcium: 9.6 mg/dL (ref 8.7–10.2)
Chloride: 105 mmol/L (ref 96–106)
Creatinine, Ser: 0.49 mg/dL — ABNORMAL LOW (ref 0.57–1.00)
Globulin, Total: 2.8 g/dL (ref 1.5–4.5)
Glucose: 83 mg/dL (ref 70–99)
Potassium: 4.2 mmol/L (ref 3.5–5.2)
Sodium: 136 mmol/L (ref 134–144)
Total Protein: 6.8 g/dL (ref 6.0–8.5)
eGFR: 133 mL/min/{1.73_m2} (ref >59)

## 2022-01-22 LAB — HCV INTERPRETATION

## 2022-01-22 LAB — HEMOGLOBIN A1C
Est. average glucose Bld gHb Est-mCnc: 123 mg/dL
Hgb A1c MFr Bld: 5.9 % — ABNORMAL HIGH (ref 4.8–5.6)

## 2022-01-23 ENCOUNTER — Encounter: Payer: BC Managed Care – PPO | Attending: Primary Care | Admitting: Registered"

## 2022-01-23 ENCOUNTER — Other Ambulatory Visit: Payer: BC Managed Care – PPO

## 2022-01-23 ENCOUNTER — Other Ambulatory Visit: Payer: Self-pay

## 2022-01-23 ENCOUNTER — Ambulatory Visit (INDEPENDENT_AMBULATORY_CARE_PROVIDER_SITE_OTHER): Payer: BC Managed Care – PPO | Admitting: Registered"

## 2022-01-23 DIAGNOSIS — O24111 Pre-existing diabetes mellitus, type 2, in pregnancy, first trimester: Secondary | ICD-10-CM | POA: Diagnosis not present

## 2022-01-23 DIAGNOSIS — O09899 Supervision of other high risk pregnancies, unspecified trimester: Secondary | ICD-10-CM | POA: Diagnosis present

## 2022-01-23 DIAGNOSIS — Z7984 Long term (current) use of oral hypoglycemic drugs: Secondary | ICD-10-CM | POA: Insufficient documentation

## 2022-01-23 DIAGNOSIS — Z3A11 11 weeks gestation of pregnancy: Secondary | ICD-10-CM | POA: Diagnosis not present

## 2022-01-23 DIAGNOSIS — Z3A12 12 weeks gestation of pregnancy: Secondary | ICD-10-CM

## 2022-01-23 LAB — CYTOLOGY - PAP
Chlamydia: NEGATIVE
Comment: NEGATIVE
Comment: NEGATIVE
Comment: NORMAL
Diagnosis: NEGATIVE
Neisseria Gonorrhea: NEGATIVE
Trichomonas: NEGATIVE

## 2022-01-23 LAB — URINE CULTURE, OB REFLEX

## 2022-01-23 LAB — CULTURE, OB URINE

## 2022-01-23 LAB — PROTEIN / CREATININE RATIO, URINE
Creatinine, Urine: 152.1 mg/dL
Protein, Ur: 23.4 mg/dL
Protein/Creat Ratio: 154 mg/g creat (ref 0–200)

## 2022-01-23 NOTE — Progress Notes (Signed)
CGM Training - Dexcom G7 Lot # [id number] Exp# 2022-07-18  Start time:1220    End time: 1250 Total time: 30 min  [x]   Download Dexcom G7  [x]   Accept invite to share data through Clarity [x]   Getting to know device: Sensor:  2 hour warmup for new sensor/transmitter Waterproof Receiver: Phone vs reader. Turn on Location and bluetooth [x]   Setting up device (high alert (default), low alert 70 mg/dL ) [x]   Setting alert profile: alarms are very loud. Also, not to panic if getting message that blood sugar is dropping []   Inserting sensor: 3" away from insulin pump - in line of site if using automated mode After application it takes 2 hr initial warm up before first reading.  []   Calibrating  []   Ending sensor session [x]   Lag time [x]   Trend arrows [x]   Going through security (don't put sensors in bags that go through xray) / MRI / CT scans []   Trouble shooting:   1000mg  acetaminophen every 6 hrs []   Tape guide:  []   Insulin dosing from CGM readings.   Patient has Dexcom tech support and my contact information.

## 2022-01-28 ENCOUNTER — Ambulatory Visit (INDEPENDENT_AMBULATORY_CARE_PROVIDER_SITE_OTHER): Payer: BC Managed Care – PPO | Admitting: Registered"

## 2022-01-28 ENCOUNTER — Encounter: Payer: BC Managed Care – PPO | Attending: Primary Care | Admitting: Registered"

## 2022-01-28 ENCOUNTER — Other Ambulatory Visit: Payer: Self-pay

## 2022-01-28 DIAGNOSIS — Z3A00 Weeks of gestation of pregnancy not specified: Secondary | ICD-10-CM | POA: Insufficient documentation

## 2022-01-28 DIAGNOSIS — Z713 Dietary counseling and surveillance: Secondary | ICD-10-CM | POA: Insufficient documentation

## 2022-01-28 DIAGNOSIS — Z3A12 12 weeks gestation of pregnancy: Secondary | ICD-10-CM

## 2022-01-28 DIAGNOSIS — O09899 Supervision of other high risk pregnancies, unspecified trimester: Secondary | ICD-10-CM | POA: Diagnosis present

## 2022-01-28 DIAGNOSIS — O24111 Pre-existing diabetes mellitus, type 2, in pregnancy, first trimester: Secondary | ICD-10-CM

## 2022-01-28 LAB — PANORAMA PRENATAL TEST FULL PANEL:PANORAMA TEST PLUS 5 ADDITIONAL MICRODELETIONS: FETAL FRACTION: 4.9

## 2022-01-28 NOTE — Patient Instructions (Addendum)
Dexcom graph from Sun, Dec 10 Analyzing timing and type of food consumed:   Plan: Avoid going too long without eating. Take balanced snacks to work to avoid lows. Treat low blood sugar with the rule of 15 If hungry after work eat a snack so you are not tempted to eat too much late at night. Drink only water or unsweetened beverages Return for follow-up in 10 weeks

## 2022-01-28 NOTE — Progress Notes (Signed)
Patient was seen for follow-up T2 Diabetes in pregnancy self-management on 01/28/2022  Start time 0920 and End time 0952  Estimated due date: 08/09/2022; [redacted]w[redacted]d  New ob intake 01/07/22; first MD visit 01/21/22  Clinical: Medications: Metformin 500 bid started on 12/27/21  (continues w/o side effects) Medical History: T2 diagnosed July 2023 Labs: OGTT n/a, A1c 9.2% 09/06/21     SMBG: last 7 days FBS: WNL Breakfast: 33% WNL Lunch:  50% WNL Dinner: 50% WNL  Patient may be able to correct PPBG with education and plan today.  NUTRITION INTERVENTION  Nutrition education (E-1) on the following topics:   Dexcom graph from Sun, Dec 10 Analyzing timing and type of food consumed:   Plan: Avoid going too long without eating. Take balanced snacks to work to avoid lows. Treat low blood sugar with the rule of 15 If hungry after work eat a snack so you are not tempted to eat too much late at night. Drink only water or unsweetened beverages  Patient instructed to monitor glucose levels: FBS: 60 - ? 95 mg/dL; PPBG 2-hour: ? 122 mg/dL  Patient received handouts: glucose log sheet  Patient will be seen for follow-up in 10 weeks for review and discuss Omnipod potential.

## 2022-01-29 ENCOUNTER — Other Ambulatory Visit: Payer: Self-pay | Admitting: Obstetrics & Gynecology

## 2022-01-29 DIAGNOSIS — O24111 Pre-existing diabetes mellitus, type 2, in pregnancy, first trimester: Secondary | ICD-10-CM

## 2022-01-29 LAB — HORIZON CUSTOM: REPORT SUMMARY: NEGATIVE

## 2022-02-17 HISTORY — DX: Maternal care for unspecified type scar from previous cesarean delivery: O34.219

## 2022-02-17 NOTE — L&D Delivery Note (Signed)
Faculty Note  Called to room for fetal bradycardia to the 60s-70s .  FHT only recovered to the 80s. Patient pushing and found to be 10/100/+2. Pt was pushing with some descent, but due to persistent bradycardia decision was made to attempt vacuum assisted delivery.  OR notified for possible need of operative delivery.  Indication for operative vaginal delivery: persistent fetal bradycardia  Patient was examined and found to be fully dilated with fetal station of +2.  Patient's bladder was noted to be empty, and there were no known fetal contraindications to operative vaginal delivery. EFW was 5 lbs 14 oz by recent ultrasound.  FHR tracing remarkable for fetal bradycardia to the 60s.  Risks of vacuum assistance were discussed in detail, including but not limited to, bleeding, infection, damage to maternal tissues, fetal cephalohematoma, inability to effect vaginal delivery of the head or shoulder dystocia that cannot be resolved by established maneuvers and need for emergency cesarean section.  Patient gave verbal consent.  The soft vacuum cup was positioned over the sagittal suture 3 cm anterior to posterior fontanelle.  Pressure was then increased to 500 mmHg, and the patient was instructed to push.  Pulling was administered along the pelvic curve while patient was pushing; there were 4 contractions and 1 popoffs.  Vacuum was reduced in between contractions.  The infant was then delivered atraumatically, noted to be a viable female infant, Apgars of 5 and 8.  Neonatology present for delivery.  There was spontaneous placental delivery, intact with three-vessel cord.  Second degree perineal laceration noted requiring repair with 2-0 Vicryl in the usual fashion.  Labial and periurethral tears also repaired.  All repairs performed by Dr. Elliot Gault.  EBL , epidural anesthesia.   Sponge, instrument and needle counts were correct x 2.  The patient and baby were stable after delivery and remained in couplet  care, with plans to transfer later to postpartum unit  Mariel Aloe, MD, FACOG Obstetrician & Gynecologist, [facility] for [employer], [facility]

## 2022-02-19 ENCOUNTER — Other Ambulatory Visit (INDEPENDENT_AMBULATORY_CARE_PROVIDER_SITE_OTHER): Payer: Self-pay | Admitting: Primary Care

## 2022-02-20 NOTE — Telephone Encounter (Signed)
Requested Prescriptions  Pending Prescriptions Disp Refills   ONETOUCH VERIO test strip [Pharmacy Med Name: [place] TEST ST(NEW)100S] 100 strip 0    Sig: CHECK BLOOD SUGAR EVERY MORNING, AT [place] AT BEDTIME     Endocrinology: Diabetes - Testing Supplies Passed - 02/19/2022 10:08 AM      Passed - Valid encounter within last 12 months    Recent Outpatient Visits           2 months ago Type 2 diabetes mellitus without complication, with long-term current use of insulin ([place])   [place] [facility] Kerin Perna, NP   5 months ago Screening for diabetes mellitus   [place], Michelle P, NP   2 years ago Dislocation of temporomandibular joint, initial encounter   [place] Kerin Perna, NP

## 2022-02-21 ENCOUNTER — Ambulatory Visit (INDEPENDENT_AMBULATORY_CARE_PROVIDER_SITE_OTHER): Payer: BC Managed Care – PPO | Admitting: Obstetrics and Gynecology

## 2022-02-21 ENCOUNTER — Other Ambulatory Visit: Payer: Self-pay

## 2022-02-21 ENCOUNTER — Encounter: Payer: Self-pay | Admitting: Obstetrics and Gynecology

## 2022-02-21 VITALS — BP 124/79 | HR 115 | Wt 207.9 lb

## 2022-02-21 DIAGNOSIS — O24111 Pre-existing diabetes mellitus, type 2, in pregnancy, first trimester: Secondary | ICD-10-CM

## 2022-02-21 DIAGNOSIS — O09899 Supervision of other high risk pregnancies, unspecified trimester: Secondary | ICD-10-CM

## 2022-02-21 DIAGNOSIS — Z98891 History of uterine scar from previous surgery: Secondary | ICD-10-CM

## 2022-02-21 DIAGNOSIS — Z3A15 15 weeks gestation of pregnancy: Secondary | ICD-10-CM

## 2022-02-21 NOTE — Progress Notes (Signed)
   PRENATAL VISIT NOTE  Subjective:  Yolanda Martinez is a 27 y.o. G2P1001 at [redacted]w[redacted]d being seen today for ongoing prenatal care.  She is currently monitored for the following issues for this high-risk pregnancy and has History of cesarean section; Type 2 diabetes mellitus ([place]); Type 2 diabetes mellitus during pregnancy; and Supervision of other high risk pregnancy, antepartum on their problem list.  Patient reports no complaints.  Contractions: Not present. Vag. Bleeding: None.  Movement: Absent. Denies leaking of fluid.   The following portions of the patient's history were reviewed and updated as appropriate: allergies, current medications, past family history, past medical history, past social history, past surgical history and problem list.   Objective:   Vitals:   02/21/22 1112  BP: 124/79  Pulse: (!) 115  Weight: 207 lb 14.4 oz (94.3 kg)    Fetal Status: Fetal Heart Rate (bpm): 117   Movement: Absent    FHT confirmed by BSUS. Unable to obtain by doppler.   General:  Alert, oriented and cooperative. Patient is in no acute distress.  Skin: Skin is warm and dry. No rash noted.   Cardiovascular: Normal heart rate noted  Respiratory: Normal respiratory effort, no problems with respiration noted  Abdomen: Soft, gravid, appropriate for gestational age.  Pain/Pressure: Present     Pelvic: Cervical exam deferred        Extremities: Normal range of motion.  Edema: None  Mental Status: Normal mood and affect. Normal behavior. Normal judgment and thought content.   Assessment and Plan:  Pregnancy: G2P1001 at [redacted]w[redacted]d 1. Pregnancy with type 2 diabetes mellitus in first trimester - CBGs reviewed: Fastings are normal but meals are variable. She does not eat consistently and the same things consistently.  - Current regimen is MTF 500 bid - Angela's note reviewed and plan of care.  - Reviewed serial growth [place]. Reviewed recommendation for IOL by 39w assuming good control of CBGs.  - A1C  went from 9.2 5 months ago to 5.9 one month ago.  - Last eye exam: within the last 2 years. No issues with current glasses prescription. Advised she go after June once delivered. Reviewed normal changes in pregnancy.   2. History of cesarean section Will review tolac consent closer to 28w but reviewed benefits of TOLAC today. She is a good candidate. Prior c-section due to NRFHT.   3. Supervision of other high risk pregnancy, antepartum MSAFP today along with TSH NIPS and horizon wnl.  Letter given for housing.    Preterm labor symptoms and general obstetric precautions including but not limited to vaginal bleeding, contractions, leaking of fluid and fetal movement were reviewed in detail with the patient. Please refer to After Visit Summary for other counseling recommendations.   No follow-ups on file.  Future Appointments  Date Time Provider [place]  03/17/2022  9:30 AM [facility] NURSE [facility]  03/17/2022  9:45 AM [facility] US4 [facility]  04/08/2022  9:15 AM [facility]    Radene Gunning, MD

## 2022-02-21 NOTE — Patient Instructions (Signed)

## 2022-02-25 LAB — AFP, SERUM, OPEN SPINA BIFIDA
AFP MoM: 0.71
AFP Value: 21.2 ng/mL
Gest. Age on Collection Date: 15.9 weeks
Maternal Age At EDD: 27.6 yr
OSBR Risk 1 IN: 10000
Test Results:: NEGATIVE
Weight: 208 [lb_av]

## 2022-02-26 LAB — SPECIMEN STATUS REPORT

## 2022-03-17 ENCOUNTER — Ambulatory Visit: Payer: BC Managed Care – PPO | Attending: Family Medicine | Admitting: Obstetrics

## 2022-03-17 ENCOUNTER — Encounter: Payer: Self-pay | Admitting: *Deleted

## 2022-03-17 ENCOUNTER — Ambulatory Visit: Payer: BC Managed Care – PPO | Admitting: *Deleted

## 2022-03-17 ENCOUNTER — Ambulatory Visit: Payer: BC Managed Care – PPO | Attending: Family Medicine

## 2022-03-17 ENCOUNTER — Other Ambulatory Visit: Payer: Self-pay | Admitting: *Deleted

## 2022-03-17 VITALS — BP 108/49 | HR 86

## 2022-03-17 DIAGNOSIS — O99212 Obesity complicating pregnancy, second trimester: Secondary | ICD-10-CM

## 2022-03-17 DIAGNOSIS — O09899 Supervision of other high risk pregnancies, unspecified trimester: Secondary | ICD-10-CM | POA: Insufficient documentation

## 2022-03-17 DIAGNOSIS — Z3A19 19 weeks gestation of pregnancy: Secondary | ICD-10-CM | POA: Diagnosis not present

## 2022-03-17 DIAGNOSIS — O24112 Pre-existing diabetes mellitus, type 2, in pregnancy, second trimester: Secondary | ICD-10-CM | POA: Diagnosis not present

## 2022-03-17 DIAGNOSIS — Z3689 Encounter for other specified antenatal screening: Secondary | ICD-10-CM

## 2022-03-17 DIAGNOSIS — O34219 Maternal care for unspecified type scar from previous cesarean delivery: Secondary | ICD-10-CM

## 2022-03-17 DIAGNOSIS — E119 Type 2 diabetes mellitus without complications: Secondary | ICD-10-CM | POA: Diagnosis not present

## 2022-03-17 DIAGNOSIS — Z363 Encounter for antenatal screening for malformations: Secondary | ICD-10-CM

## 2022-03-17 DIAGNOSIS — Z7984 Long term (current) use of oral hypoglycemic drugs: Secondary | ICD-10-CM

## 2022-03-17 DIAGNOSIS — E669 Obesity, unspecified: Secondary | ICD-10-CM

## 2022-03-17 NOTE — Progress Notes (Signed)
MFM Note  Yolanda Martinez was seen for a detailed fetal anatomy scan due to pregestational diabetes treated with metformin 500 mg twice a day.  Her hemoglobin A1c drawn 1 month ago was 5.9%.  She was diagnosed with type II diabetes about 8 months ago.  She reports good glycemic control.  She denies any other problems in her current pregnancy.    She had a cell free DNA test earlier in her pregnancy which indicated a low risk for trisomy 54, 26, and 13. A female fetus is predicted. An MSAFP was 0.71 MoM.  She was informed that the fetal growth and amniotic fluid level were appropriate for her gestational age.   There were no obvious fetal anomalies noted on today's ultrasound exam.  However, today's exam was limited due to the fetal position and maternal body habitus.  The patient was informed that anomalies may be missed due to technical limitations. If the fetus is in a suboptimal position or maternal habitus is increased, visualization of the fetus in the maternal uterus may be impaired.  The following were discussed during today's consultation:  Pregestational diabetes in pregnancy  The implications and management of diabetes in pregnancy was discussed in detail with the patient.   The goals for her fingerstick values are fasting values of 90-95 or less and two-hour postprandials of 120 or less.    Should the majority of her fingerstick values be above these values, her metformin dosage may need to be increased or she may need to be started on insulin to help her achieve better glycemic control.  Due to pregestational diabetes in pregnancy, we will continue to follow her with growth ultrasounds throughout her pregnancy.   She was referred to [facility] pediatric cardiology for a fetal echocardiogram.    We will start weekly fetal testing at around 32 weeks.    Should her fetal testing and fetal growth to be within normal limits along with normal glycemic control, delivery will be  recommended at around 39 weeks.    Should her glycemic control be poor or should a large for gestational age fetus to be noted later in her pregnancy, delivery may be considered at around 4 weeks.   A follow-up exam was scheduled in 4 weeks to complete the views of the fetal anatomy and to assess the fetal growth.    The patient stated that all of her questions were answered today.  A total of 45 minutes was spent counseling and coordinating the care for this patient.  Greater than 50% of the time was spent in direct face-to-face contact.

## 2022-03-18 ENCOUNTER — Other Ambulatory Visit: Payer: Self-pay | Admitting: Obstetrics & Gynecology

## 2022-03-18 DIAGNOSIS — O24111 Pre-existing diabetes mellitus, type 2, in pregnancy, first trimester: Secondary | ICD-10-CM

## 2022-03-21 ENCOUNTER — Ambulatory Visit (INDEPENDENT_AMBULATORY_CARE_PROVIDER_SITE_OTHER): Payer: BC Managed Care – PPO | Admitting: Obstetrics and Gynecology

## 2022-03-21 ENCOUNTER — Other Ambulatory Visit: Payer: Self-pay

## 2022-03-21 VITALS — BP 121/61 | HR 80 | Wt 213.2 lb

## 2022-03-21 DIAGNOSIS — O09892 Supervision of other high risk pregnancies, second trimester: Secondary | ICD-10-CM

## 2022-03-21 DIAGNOSIS — O09899 Supervision of other high risk pregnancies, unspecified trimester: Secondary | ICD-10-CM

## 2022-03-21 DIAGNOSIS — O24112 Pre-existing diabetes mellitus, type 2, in pregnancy, second trimester: Secondary | ICD-10-CM

## 2022-03-21 DIAGNOSIS — Z98891 History of uterine scar from previous surgery: Secondary | ICD-10-CM

## 2022-03-21 DIAGNOSIS — Z3A19 19 weeks gestation of pregnancy: Secondary | ICD-10-CM

## 2022-03-21 MED ORDER — METFORMIN HCL 1000 MG PO TABS: 1000.0000 mg | ORAL_TABLET | Freq: Two times a day (BID) | ORAL | 4 refills | Status: DC

## 2022-03-21 NOTE — Progress Notes (Signed)
   PRENATAL VISIT NOTE  Subjective:  Yolanda Martinez is a 27 y.o. G2P1001 at [redacted]w[redacted]d being seen today for ongoing prenatal care.  She is currently monitored for the following issues for this high-risk pregnancy and has History of cesarean section; Type 2 diabetes mellitus ([place]); Type 2 diabetes mellitus during pregnancy; and Supervision of other high risk pregnancy, antepartum on their problem list.  Patient reports no complaints.  Contractions: Irritability. Vag. Bleeding: None.  Movement: Present. Denies leaking of fluid.   The following portions of the patient's history were reviewed and updated as appropriate: allergies, current medications, past family history, past medical history, past social history, past surgical history and problem list.   Objective:   Vitals:   03/21/22 1118 03/21/22 1122  BP: (!) 136/58 121/61  Pulse: 88 80  Weight: 213 lb 3.2 oz (96.7 kg)     Fetal Status: Fetal Heart Rate (bpm): 136   Movement: Present     General:  Alert, oriented and cooperative. Patient is in no acute distress.  Skin: Skin is warm and dry. No rash noted.   Cardiovascular: Normal heart rate noted  Respiratory: Normal respiratory effort, no problems with respiration noted  Abdomen: Soft, gravid, appropriate for gestational age.  Pain/Pressure: Present     Pelvic: Cervical exam deferred        Extremities: Normal range of motion.  Edema: None  Mental Status: Normal mood and affect. Normal behavior. Normal judgment and thought content.   Assessment and Plan:  Pregnancy: G2P1001 at [redacted]w[redacted]d 1. [redacted] weeks gestation of pregnancy Screening TSH today. Normal anatomy on 1/29 - TSH Rfx on Abnormal to Free T4  2. History of cesarean section D/w pt more later in pregnancy  3. Supervision of other high risk pregnancy, antepartum - TSH Rfx on Abnormal to Free T4  4. Pregnancy with type 2 diabetes mellitus in second trimester Patient on metformin 500 bid. AM fasting in the 90s. 2 hour post  prandial 110s to 140s. Recommend increasing to 1000 bid and RTC sooner than usual to review sugars. D/w her re: possible need for insulin at some point in the pregnancy given she is on 20wks   Afp negative. Patient hasn't heard yet about fetal echo scheduling. Follow up next visit - TSH Rfx on Abnormal to Free T4  Preterm labor symptoms and general obstetric precautions including but not limited to vaginal bleeding, contractions, leaking of fluid and fetal movement were reviewed in detail with the patient. Please refer to After Visit Summary for other counseling recommendations.   Return in about 2 weeks (around 04/04/2022) for in person, high risk ob, md visit.  Future Appointments  Date Time Provider [place]  04/08/2022  9:15 AM [facility]  04/14/2022  9:30 AM [facility] NURSE [facility]  04/14/2022  9:45 AM [facility] US7 [facility]    Aletha Halim, MD

## 2022-03-21 NOTE — Patient Instructions (Signed)

## 2022-03-22 LAB — TSH RFX ON ABNORMAL TO FREE T4: TSH: 1.46 u[IU]/mL (ref 0.450–4.500)

## 2022-04-02 ENCOUNTER — Other Ambulatory Visit (INDEPENDENT_AMBULATORY_CARE_PROVIDER_SITE_OTHER): Payer: Self-pay | Admitting: Primary Care

## 2022-04-02 NOTE — Telephone Encounter (Signed)
Requested Prescriptions  Pending Prescriptions Disp Refills   ONETOUCH VERIO test strip [Pharmacy Med Name: ONE TOUCH VERIO TEST ST(NEW)100S] 100 strip 0    Sig: TEST BLOOD SUGAR EVERY MORNING, AT NOON, EVERY EVENING AND AT BEDTIME     Endocrinology: Diabetes - Testing Supplies Passed - 04/02/2022 10:42 AM      Passed - Valid encounter within last 12 months    Recent Outpatient Visits           3 months ago Type 2 diabetes mellitus without complication, with long-term current use of insulin ([place])   [place], Michelle P, NP   6 months ago Screening for diabetes mellitus    [facility] Kerin Perna, NP   2 years ago Dislocation of temporomandibular joint, initial encounter   [place], Michelle P, NP

## 2022-04-03 NOTE — Progress Notes (Signed)
   PRENATAL VISIT NOTE  Subjective:  Yolanda Martinez is a 27 y.o. G2P1001 at 2w2dbeing seen today for ongoing prenatal care.  She is currently monitored for the following issues for this high-risk pregnancy and has History of cesarean section; Type 2 diabetes mellitus ([place]; Type 2 diabetes mellitus during pregnancy; and Supervision of other high risk pregnancy, antepartum on their problem list.  Patient reports no complaints.  Contractions: Not present. Vag. Bleeding: None.  Movement: Present. Denies leaking of fluid.   The following portions of the patient's history were reviewed and updated as appropriate: allergies, current medications, past family history, past medical history, past social history, past surgical history and problem list.   Objective:   Vitals:   04/07/22 0943  BP: 125/81  Pulse: 80  Weight: 209 lb 4.8 oz (94.9 kg)    Fetal Status: Fetal Heart Rate (bpm): 144   Movement: Present     General:  Alert, oriented and cooperative. Patient is in no acute distress.  Skin: Skin is warm and dry. No rash noted.   Cardiovascular: Normal heart rate noted  Respiratory: Normal respiratory effort, no problems with respiration noted  Abdomen: Soft, gravid, appropriate for gestational age.  Pain/Pressure: Present     Pelvic: Cervical exam deferred        Extremities: Normal range of motion.  Edema: None  Mental Status: Normal mood and affect. Normal behavior. Normal judgment and thought content.   Assessment and Plan:  Pregnancy: G2P1001 at [redacted]w[redacted]d. Supervision of other high risk pregnancy, antepartum MSAFP wnl. Recent TSH normal.   2. Pregnancy with type 2 diabetes mellitus in second trimester Current regimen: MTF 1000 bid CBG review: Fastings are all less than 80 and most meals are normal some running in the 70s.  Regimen changes: none Growth [place] f/u on 2/26.  Antenatal monitoring: Begin antenatal testing at 32 weeks Other needs: Fetal echo - scheduled March  15th.    3. History of cesarean section Desires TOLAC -  06/2017 op note reviewed. CD done for NRFHT. From documentation she got to at least 6 cm prior to this.   Preterm labor symptoms and general obstetric precautions including but not limited to vaginal bleeding, contractions, leaking of fluid and fetal movement were reviewed in detail with the patient. Please refer to After Visit Summary for other counseling recommendations.   Return in about 4 weeks (around 05/05/2022) for OB VISIT, MD or APP.  Future Appointments  Date Time Provider [place]2/20/2024  [facility]26[facility]20[facility]  9:30 AM [facility]  9:45 AM [facility] US7 [facility]    PaRadene GunningMD

## 2022-04-07 ENCOUNTER — Encounter: Payer: Self-pay | Admitting: Obstetrics and Gynecology

## 2022-04-07 ENCOUNTER — Ambulatory Visit (INDEPENDENT_AMBULATORY_CARE_PROVIDER_SITE_OTHER): Payer: BC Managed Care – PPO | Admitting: Obstetrics and Gynecology

## 2022-04-07 ENCOUNTER — Other Ambulatory Visit: Payer: Self-pay

## 2022-04-07 VITALS — BP 125/81 | HR 80 | Wt 209.3 lb

## 2022-04-07 DIAGNOSIS — O09899 Supervision of other high risk pregnancies, unspecified trimester: Secondary | ICD-10-CM

## 2022-04-07 DIAGNOSIS — Z3A22 22 weeks gestation of pregnancy: Secondary | ICD-10-CM

## 2022-04-07 DIAGNOSIS — O24112 Pre-existing diabetes mellitus, type 2, in pregnancy, second trimester: Secondary | ICD-10-CM

## 2022-04-07 DIAGNOSIS — Z98891 History of uterine scar from previous surgery: Secondary | ICD-10-CM

## 2022-04-07 DIAGNOSIS — Z0289 Encounter for other administrative examinations: Secondary | ICD-10-CM

## 2022-04-08 ENCOUNTER — Encounter: Payer: BC Managed Care – PPO | Attending: Family Medicine | Admitting: Registered"

## 2022-04-08 ENCOUNTER — Ambulatory Visit (INDEPENDENT_AMBULATORY_CARE_PROVIDER_SITE_OTHER): Payer: BC Managed Care – PPO | Admitting: Registered"

## 2022-04-08 DIAGNOSIS — O09899 Supervision of other high risk pregnancies, unspecified trimester: Secondary | ICD-10-CM | POA: Diagnosis present

## 2022-04-08 DIAGNOSIS — O24112 Pre-existing diabetes mellitus, type 2, in pregnancy, second trimester: Secondary | ICD-10-CM

## 2022-04-08 DIAGNOSIS — Z3A11 11 weeks gestation of pregnancy: Secondary | ICD-10-CM | POA: Diagnosis not present

## 2022-04-08 DIAGNOSIS — O24111 Pre-existing diabetes mellitus, type 2, in pregnancy, first trimester: Secondary | ICD-10-CM | POA: Diagnosis not present

## 2022-04-08 DIAGNOSIS — Z3A22 22 weeks gestation of pregnancy: Secondary | ICD-10-CM

## 2022-04-08 DIAGNOSIS — Z7984 Long term (current) use of oral hypoglycemic drugs: Secondary | ICD-10-CM | POA: Insufficient documentation

## 2022-04-08 NOTE — Progress Notes (Addendum)
Patient was seen for follow-up for T2 Diabetes in pregnancy self-management  Start time 0920 and End time 1000  Estimated due date: 08/09/2022; [redacted]w[redacted]d Clinical: Medications: Metformin 1000 bid Medical History: T2D Labs: OGTT n/a, A1c 9.2% 09/06/21  SMBG: last 7 days FBS: 68-102 mg/dL PPBG: 69-131 mg/dL  Assessement Pt reports her eating schedule changes depending on if she works her second job at [employer] On those days she gets off work at 10 pm and will eat when she gets home, otherwise has dinner 7-9 pm. Although FBS are mostly WNL discussed changes that she might expect as she gets farther into the 3rd trimester. Pt may be able to make some small changes to bring more of her PPBG WNL.  24-hr dietary recall: B: none this morning S: banana L: green beans, chicken, <1 c mac & cheese S: none D: pizza, bbq chips Bev: water, regular koolaid, juicy juice  NUTRITION INTERVENTION  Nutrition education (E-1) on the following topics:  Insulin needs over course of pregnancy Insulin pens vs pump therapy Carb counting and pump therapy  Plan: To avoid going too long without eating, eat snacks with protein  Include vegetables daily If hungry after work eat small meal, avoid high fat meals later than 7 pm Drink only water or unsweetened beverages Return for follow up after in 3rd trimester to evaluate blood sugar control again  Patient instructed to monitor glucose levels: FBS: 60 - ? 95 mg/dL; PPBG 2-hour: ? 120 mg/dL  Patient received handouts: Insulin graph (basal and bolus)  Patient will be seen for follow-up in 4 weeks for review and discuss insulin uses if appropriate.

## 2022-04-14 ENCOUNTER — Other Ambulatory Visit: Payer: Self-pay | Admitting: *Deleted

## 2022-04-14 ENCOUNTER — Ambulatory Visit: Payer: BC Managed Care – PPO | Attending: Obstetrics

## 2022-04-14 ENCOUNTER — Ambulatory Visit: Payer: BC Managed Care – PPO | Admitting: *Deleted

## 2022-04-14 VITALS — BP 114/62 | HR 80

## 2022-04-14 DIAGNOSIS — O24112 Pre-existing diabetes mellitus, type 2, in pregnancy, second trimester: Secondary | ICD-10-CM

## 2022-04-14 DIAGNOSIS — O99212 Obesity complicating pregnancy, second trimester: Secondary | ICD-10-CM

## 2022-04-14 DIAGNOSIS — O34219 Maternal care for unspecified type scar from previous cesarean delivery: Secondary | ICD-10-CM | POA: Insufficient documentation

## 2022-04-14 DIAGNOSIS — E119 Type 2 diabetes mellitus without complications: Secondary | ICD-10-CM | POA: Diagnosis not present

## 2022-04-14 DIAGNOSIS — Z7984 Long term (current) use of oral hypoglycemic drugs: Secondary | ICD-10-CM

## 2022-04-14 DIAGNOSIS — E669 Obesity, unspecified: Secondary | ICD-10-CM

## 2022-04-14 DIAGNOSIS — Z3A23 23 weeks gestation of pregnancy: Secondary | ICD-10-CM

## 2022-05-01 NOTE — Progress Notes (Signed)
PRENATAL VISIT NOTE  Subjective:  Yolanda Martinez is a 27 y.o. G2P1001 at [redacted]w[redacted]d being seen today for ongoing prenatal care.  She is currently monitored for the following issues for this high-risk pregnancy and has History of cesarean section; Type 2 diabetes mellitus ([place]); Type 2 diabetes mellitus during pregnancy; and Supervision of other high risk pregnancy, antepartum on their problem list.  Patient reports  allergies .  Contractions: Not present. Vag. Bleeding: None.  Movement: Present. Denies leaking of fluid.   The following portions of the patient's history were reviewed and updated as appropriate: allergies, current medications, past family history, past medical history, past social history, past surgical history and problem list.   Objective:   Vitals:   05/05/22 0930  BP: 126/64  Pulse: 90  Weight: 216 lb (98 kg)    Fetal Status: Fetal Heart Rate (bpm): 130   Movement: Present     General:  Alert, oriented and cooperative. Patient is in no acute distress.  Skin: Skin is warm and dry. No rash noted.   Cardiovascular: Normal heart rate noted  Respiratory: Normal respiratory effort, no problems with respiration noted  Abdomen: Soft, gravid, appropriate for gestational age.  Pain/Pressure: Present     Pelvic: Cervical exam deferred        Extremities: Normal range of motion.  Edema: None  Mental Status: Normal mood and affect. Normal behavior. Normal judgment and thought content.   Assessment and Plan:  Pregnancy: G2P1001 at [redacted]w[redacted]d 1. Pregnancy with type 2 diabetes mellitus in second trimester Current regimen: MTF 1000 bid CBG review: Fastings - 80-90.  Meals mostly normal (When follows diet) Regimen changes: none Growth [place]: 3/26 Antenatal monitoring: Begin antenatal testing at 32 weeks Other needs: Fetal echo 3/15 - wnl   2. Supervision of other high risk pregnancy, antepartum 28 wk labs today (except 2 hr) Offer tdap next time.   3. History of cesarean  section - We discussed her history of c-section. Her previous c-section was due to  non-reassuring fetal heart tones.  She has a history of  no prior successful vaginal deliveries - We discussed the risks associated with repeat c-section: bleeding, infection, injury to surrounding organs/tissues I.e. bowel/bladder, development of scar tissue, wound complications such as wound separation or infection, need for additional surgery, percreta/acreta - We discussed the risks associated with TOLAC: risk of it being unsuccessful, specially in the context of her history, the risks in general of a vaginal delivery (prolapse, SUI, differences in recovery, pelvic floor dysfunction, etc), and the risk of uterine rupture. We discussed with the risk of uterine rupture that while rare it is not easily predicted, that it is a surgical emergency, and it can be potentially catastrophic for mom and baby. We discussed if uterine rupture that it may necessitate hysterectomy if the rupture caused issues with bleeding that could not be managed with other surgical options.  - After counseling, the patient was given the opportunity to ask questions and all questions answered.  - After considering her options, she would like to [place] provided to the patient   Preterm labor symptoms and general obstetric precautions including but not limited to vaginal bleeding, contractions, leaking of fluid and fetal movement were reviewed in detail with the patient. Please refer to After Visit Summary for other counseling recommendations.   Return in about 3 weeks (around 05/26/2022) for HROB VISIT, MD only.  Future Appointments  Date Time Provider [place]  05/06/2022  9:15 AM [facility]  [facility]  05/13/2022  8:30 AM [facility] NURSE [facility]  05/13/2022  8:45 AM [facility] US5 [facility] [place]    Radene Gunning, MD

## 2022-05-05 ENCOUNTER — Other Ambulatory Visit: Payer: Self-pay

## 2022-05-05 ENCOUNTER — Ambulatory Visit (INDEPENDENT_AMBULATORY_CARE_PROVIDER_SITE_OTHER): Payer: BC Managed Care – PPO | Admitting: Obstetrics and Gynecology

## 2022-05-05 ENCOUNTER — Encounter: Payer: Self-pay | Admitting: Obstetrics and Gynecology

## 2022-05-05 ENCOUNTER — Other Ambulatory Visit (INDEPENDENT_AMBULATORY_CARE_PROVIDER_SITE_OTHER): Payer: Self-pay | Admitting: Primary Care

## 2022-05-05 VITALS — BP 126/64 | HR 90 | Wt 216.0 lb

## 2022-05-05 DIAGNOSIS — Z98891 History of uterine scar from previous surgery: Secondary | ICD-10-CM

## 2022-05-05 DIAGNOSIS — O24112 Pre-existing diabetes mellitus, type 2, in pregnancy, second trimester: Secondary | ICD-10-CM

## 2022-05-05 DIAGNOSIS — Z23 Encounter for immunization: Secondary | ICD-10-CM | POA: Diagnosis not present

## 2022-05-05 DIAGNOSIS — Z3A26 26 weeks gestation of pregnancy: Secondary | ICD-10-CM

## 2022-05-05 DIAGNOSIS — J302 Other seasonal allergic rhinitis: Secondary | ICD-10-CM

## 2022-05-05 DIAGNOSIS — O99012 Anemia complicating pregnancy, second trimester: Secondary | ICD-10-CM

## 2022-05-05 DIAGNOSIS — O09899 Supervision of other high risk pregnancies, unspecified trimester: Secondary | ICD-10-CM

## 2022-05-05 DIAGNOSIS — O09892 Supervision of other high risk pregnancies, second trimester: Secondary | ICD-10-CM

## 2022-05-05 MED ORDER — CETIRIZINE HCL 10 MG PO TABS: 10.0000 mg | ORAL_TABLET | Freq: Every day | ORAL | 3 refills | Status: DC

## 2022-05-06 ENCOUNTER — Ambulatory Visit (INDEPENDENT_AMBULATORY_CARE_PROVIDER_SITE_OTHER): Payer: BC Managed Care – PPO | Admitting: Registered"

## 2022-05-06 ENCOUNTER — Encounter: Payer: BC Managed Care – PPO | Attending: Family Medicine | Admitting: Registered"

## 2022-05-06 DIAGNOSIS — O99012 Anemia complicating pregnancy, second trimester: Secondary | ICD-10-CM | POA: Insufficient documentation

## 2022-05-06 DIAGNOSIS — Z3A26 26 weeks gestation of pregnancy: Secondary | ICD-10-CM

## 2022-05-06 DIAGNOSIS — O24111 Pre-existing diabetes mellitus, type 2, in pregnancy, first trimester: Secondary | ICD-10-CM | POA: Diagnosis not present

## 2022-05-06 DIAGNOSIS — Z3A11 11 weeks gestation of pregnancy: Secondary | ICD-10-CM | POA: Insufficient documentation

## 2022-05-06 DIAGNOSIS — O09899 Supervision of other high risk pregnancies, unspecified trimester: Secondary | ICD-10-CM | POA: Insufficient documentation

## 2022-05-06 DIAGNOSIS — Z7984 Long term (current) use of oral hypoglycemic drugs: Secondary | ICD-10-CM | POA: Insufficient documentation

## 2022-05-06 DIAGNOSIS — O24112 Pre-existing diabetes mellitus, type 2, in pregnancy, second trimester: Secondary | ICD-10-CM

## 2022-05-06 HISTORY — DX: Anemia complicating pregnancy, second trimester: O99.012

## 2022-05-06 LAB — CBC
Hematocrit: 32.1 % — ABNORMAL LOW (ref 34.0–46.6)
Hemoglobin: 10.6 g/dL — ABNORMAL LOW (ref 11.1–15.9)
MCH: 28.2 pg (ref 26.6–33.0)
MCHC: 33 g/dL (ref 31.5–35.7)
MCV: 85 fL (ref 79–97)
Platelets: 240 10*3/uL (ref 150–450)
RBC: 3.76 x10E6/uL — ABNORMAL LOW (ref 3.77–5.28)
RDW: 13.3 % (ref 11.7–15.4)
WBC: 7.8 10*3/uL (ref 3.4–10.8)

## 2022-05-06 LAB — RPR: RPR Ser Ql: NONREACTIVE

## 2022-05-06 LAB — HIV ANTIBODY (ROUTINE TESTING W REFLEX): HIV Screen 4th Generation wRfx: NONREACTIVE

## 2022-05-06 MED ORDER — FERROUS SULFATE 324 (65 FE) MG PO TBEC: 1.0000 | DELAYED_RELEASE_TABLET | ORAL | 1 refills | Status: DC

## 2022-05-06 NOTE — Patient Instructions (Addendum)
Increase your water intake to 4 cups instead 2 with a goal to work up to 8 cups. Include fruits and vegetables daily. Aim to eat those high in calcium and vitamin D, nuts for snacks, beans and broccoli, spinach with meals. Cook your oatmeal in milk Include more Greek yogurt in your diet

## 2022-05-06 NOTE — Addendum Note (Signed)
Addended by: Radene Gunning A on: 05/06/2022 08:49 AM   Modules accepted: Orders

## 2022-05-06 NOTE — Progress Notes (Signed)
Patient was seen for follow-up for T2 Diabetes in pregnancy self-management  Start time 0913 and End time 0940  Estimated due date: 08/09/2022; [redacted]w[redacted]d  Clinical: Medications: Metformin 1000 bid Medical History: T2D Labs: OGTT n/a, A1c 9.2% 09/06/21    Assessment  Pt states sometimes at work feels tired or dizzy if standing up too long, due to working without taking breaks, sometimes doesn't have an appetite and not eating a lot, but tried to eat snacks.  Pt states although didn't eat fruit or vegetables yesterday, usually does, just had ran out. Will eat them today.  Discussed frequency of elevated BG and if trend of last few days continues would likely need to start insulin. Pt states she would like a couple of weeks to make some changes and reevaluate BG log.  24-hr dietary recall: B: frappe and sausage biscuit (one of the few elevated PPBG) S: none L: hamburger helper (left overs) S: none D: 2 hot dogs and french fries (2 buns and fries = BG 165) Bev: 2 cups water, zero sugar soda, small amounts of minute maid juice or juicy juice, flavored water 1x/week.  NUTRITION INTERVENTION  Nutrition education (E-1) on the following topics:  Water Fruit and vegetables Calcium Acceptable control vs need for insulin  Plan: Increase your water intake to 4 cups instead 2 with a goal to work up to 8 cups. Include fruits and vegetables daily. Aim to eat those high in calcium and vitamin D, nuts for snacks, beans and broccoli, spinach with meals. Cook your oatmeal in milk Include more Greek yogurt in your diet  Patient instructed to monitor glucose levels: FBS: 60 - ? 95 mg/dL; PPBG 2-hour: ? 120 mg/dL  Patient received handouts: food sources for calcium & iron   Patient will be seen for follow-up in 2 weeks

## 2022-05-07 ENCOUNTER — Encounter (INDEPENDENT_AMBULATORY_CARE_PROVIDER_SITE_OTHER): Payer: Self-pay | Admitting: Primary Care

## 2022-05-07 ENCOUNTER — Other Ambulatory Visit (INDEPENDENT_AMBULATORY_CARE_PROVIDER_SITE_OTHER): Payer: Self-pay | Admitting: Primary Care

## 2022-05-08 ENCOUNTER — Other Ambulatory Visit (INDEPENDENT_AMBULATORY_CARE_PROVIDER_SITE_OTHER): Payer: Self-pay | Admitting: Primary Care

## 2022-05-08 MED ORDER — ONETOUCH VERIO VI STRP: ORAL_STRIP | 1 refills | Status: DC

## 2022-05-08 NOTE — Telephone Encounter (Signed)
Last refill 04/02/22 for 100 strips, patient uses strips 4 x a day. Patient inquiring about refills through my chart. Will refill.  Requested Prescriptions  Pending Prescriptions Disp Refills   glucose blood (ONETOUCH VERIO) test strip 200 strip 1    Sig: TEST BLOOD SUGAR EVERY MORNING, AT NOON, EVERY EVENING AND AT BEDTIME     Endocrinology: Diabetes - Testing Supplies Passed - 05/08/2022  9:59 AM      Passed - Valid encounter within last 12 months    Recent Outpatient Visits           5 months ago Type 2 diabetes mellitus without complication, with long-term current use of insulin ([place])   [place] [facility] Kerin Perna, NP   8 months ago Screening for diabetes mellitus   [place] [facility] Kerin Perna, NP   2 years ago Dislocation of temporomandibular joint, initial encounter   [place], Michelle P, NP

## 2022-05-08 NOTE — Telephone Encounter (Signed)
Medication Refill - Medication: Roma Schanz test strip [id number]   Has the patient contacted their pharmacy? Yes.    (Agent: If yes, when and what did the pharmacy advise?) Contact Provider   Preferred Pharmacy (with phone number or street name): [facility] [phone] - [place], [place]   Has the patient been seen for an appointment in the last year OR does the patient have an upcoming appointment? Yes.   Agent: Please be advised that RX refills may take up to 3 business days. We ask that you follow-up with your pharmacy.   Pt says pharmacy said the refill was denied by the provider. Pt says she is [redacted] weeks pregnant and needs her test strips. Pt says if there is an issue filling this to please follow up with her.

## 2022-05-10 ENCOUNTER — Other Ambulatory Visit (INDEPENDENT_AMBULATORY_CARE_PROVIDER_SITE_OTHER): Payer: Self-pay | Admitting: Primary Care

## 2022-05-12 ENCOUNTER — Ambulatory Visit: Payer: BC Managed Care – PPO

## 2022-05-13 ENCOUNTER — Ambulatory Visit: Payer: BC Managed Care – PPO | Attending: Obstetrics

## 2022-05-13 ENCOUNTER — Other Ambulatory Visit (INDEPENDENT_AMBULATORY_CARE_PROVIDER_SITE_OTHER): Payer: Self-pay | Admitting: Primary Care

## 2022-05-13 ENCOUNTER — Other Ambulatory Visit: Payer: Self-pay | Admitting: *Deleted

## 2022-05-13 ENCOUNTER — Ambulatory Visit: Payer: BC Managed Care – PPO | Admitting: *Deleted

## 2022-05-13 VITALS — BP 120/64 | HR 91

## 2022-05-13 DIAGNOSIS — O34219 Maternal care for unspecified type scar from previous cesarean delivery: Secondary | ICD-10-CM

## 2022-05-13 DIAGNOSIS — O99212 Obesity complicating pregnancy, second trimester: Secondary | ICD-10-CM

## 2022-05-13 DIAGNOSIS — O24112 Pre-existing diabetes mellitus, type 2, in pregnancy, second trimester: Secondary | ICD-10-CM | POA: Diagnosis present

## 2022-05-13 DIAGNOSIS — E119 Type 2 diabetes mellitus without complications: Secondary | ICD-10-CM

## 2022-05-13 DIAGNOSIS — Z3A27 27 weeks gestation of pregnancy: Secondary | ICD-10-CM

## 2022-05-13 DIAGNOSIS — E669 Obesity, unspecified: Secondary | ICD-10-CM

## 2022-05-20 ENCOUNTER — Other Ambulatory Visit: Payer: Self-pay

## 2022-05-20 DIAGNOSIS — E119 Type 2 diabetes mellitus without complications: Secondary | ICD-10-CM

## 2022-05-20 DIAGNOSIS — O24113 Pre-existing diabetes mellitus, type 2, in pregnancy, third trimester: Secondary | ICD-10-CM

## 2022-05-22 ENCOUNTER — Ambulatory Visit (INDEPENDENT_AMBULATORY_CARE_PROVIDER_SITE_OTHER): Payer: BC Managed Care – PPO | Admitting: Registered"

## 2022-05-22 ENCOUNTER — Other Ambulatory Visit: Payer: Self-pay

## 2022-05-22 ENCOUNTER — Encounter: Payer: BC Managed Care – PPO | Attending: Family Medicine | Admitting: Registered"

## 2022-05-22 DIAGNOSIS — O24111 Pre-existing diabetes mellitus, type 2, in pregnancy, first trimester: Secondary | ICD-10-CM | POA: Insufficient documentation

## 2022-05-22 DIAGNOSIS — Z3A28 28 weeks gestation of pregnancy: Secondary | ICD-10-CM

## 2022-05-22 DIAGNOSIS — Z7984 Long term (current) use of oral hypoglycemic drugs: Secondary | ICD-10-CM | POA: Insufficient documentation

## 2022-05-22 DIAGNOSIS — O09899 Supervision of other high risk pregnancies, unspecified trimester: Secondary | ICD-10-CM | POA: Diagnosis present

## 2022-05-22 DIAGNOSIS — Z3A11 11 weeks gestation of pregnancy: Secondary | ICD-10-CM | POA: Insufficient documentation

## 2022-05-22 DIAGNOSIS — O24113 Pre-existing diabetes mellitus, type 2, in pregnancy, third trimester: Secondary | ICD-10-CM

## 2022-05-22 NOTE — Progress Notes (Signed)
Patient was seen for follow-up for T2 Diabetes in pregnancy self-management  Start time 1030 and End time 1107  Estimated due date: 08/09/2022; [redacted]w[redacted]d  Clinical: Medications: Metformin 1000 bid Medical History: T2D Labs: OGTT n/a, A1c 9.2% 09/06/21  Patient was seen by diabetes educator 2 weeks ago. Patient was advised that she would likely need insulin, but patient wanted 2 more weeks to see if she could manage with metformin and dietary changes.   Patient continues to have frequent elevated readings and often misses taking blood sugar in the evenings when eating late. Pt states she is willing to start insulin if needed and is looking forward to being able to use the Dexcom G6 again.    Assessment Pt reports barriers to working on goals of eating foods high in calcium and vitamin D such as nuts for snacks, beans and broccoli, spinach with meals.  Pt states as not tried cooking oatmeal in milk yet, is still planning on trying that. Pt states has not started including more [place] yogurt in diet yet, waiting to talk to [facility] about getting it back in benefits. Pt plans to visit [employer] today.  Pt states when she eats dinner late goes to sleep within ~30 min so does not check 2 hrs after.  Pt states still is have less appetite in the afternoon and may have just a little snack and doesn't check BG.  Exercise: ADL - walks a lot at work  Sleep: not assessed this visit.  24-hr dietary recall: B: Cheese its and pop tart S: none L: spagettios meat balls, S: none D: chicken nuggets and hand portion size french fries Bev: 48 oz water, zero sugar soda occasionally, ~6 oz minute maid juice or juicy juice and adds water to dilute  NUTRITION INTERVENTION  Nutrition education (E-1) on the following topics:  Basal & bolus insulin Consistent carb/protein in meals  Basics of insulin pens  Plan: You will probably be starting insulin on Monday. If you get the Dexcom G6 prescribed before then,  go ahead and start using it. Wait to get further instruction from the DM, Pharmacist or nurse before starting insulin.  Goals: Great job on increasing water intake - keep it up :-)  Diet -- Continue working on eating vegetables daily and including more [place] yogurt, nuts, beans, greens.  Exercise: 10 min brisk walk before dinner  Patient will be seen for follow-up 06/10/22 (next available appt)

## 2022-05-22 NOTE — Patient Instructions (Addendum)
Plan: You will probably be starting insulin on Monday. If you get the Dexcom G6 prescribed before then, go ahead and start using it. Wait to get further instruction from the MD, Pharmacist or nurse before starting insulin.  Goals: Great job on increasing water intake - keep it up :-)  Diet -- Continue working on eating vegetables daily and including more [place] yogurt, nuts, beans, greens.  Exercise: 10 min brisk walk before dinner

## 2022-05-26 ENCOUNTER — Encounter: Payer: Self-pay | Admitting: Pharmacist

## 2022-05-26 ENCOUNTER — Other Ambulatory Visit: Payer: Self-pay

## 2022-05-26 ENCOUNTER — Ambulatory Visit (INDEPENDENT_AMBULATORY_CARE_PROVIDER_SITE_OTHER): Payer: BC Managed Care – PPO | Admitting: Obstetrics & Gynecology

## 2022-05-26 ENCOUNTER — Encounter: Payer: Self-pay | Admitting: Obstetrics & Gynecology

## 2022-05-26 VITALS — BP 134/78 | HR 121 | Wt 212.0 lb

## 2022-05-26 DIAGNOSIS — O09899 Supervision of other high risk pregnancies, unspecified trimester: Secondary | ICD-10-CM

## 2022-05-26 DIAGNOSIS — Z98891 History of uterine scar from previous surgery: Secondary | ICD-10-CM

## 2022-05-26 DIAGNOSIS — O24113 Pre-existing diabetes mellitus, type 2, in pregnancy, third trimester: Secondary | ICD-10-CM

## 2022-05-26 DIAGNOSIS — O09893 Supervision of other high risk pregnancies, third trimester: Secondary | ICD-10-CM

## 2022-05-26 DIAGNOSIS — Z3A29 29 weeks gestation of pregnancy: Secondary | ICD-10-CM

## 2022-05-26 MED ORDER — PEN NEEDLES 32G X 5 MM MISC: 11 refills | Status: DC

## 2022-05-26 MED ORDER — DEXCOM G6 SENSOR MISC: 11 refills | Status: DC

## 2022-05-26 MED ORDER — NOVOLOG FLEXPEN 100 UNIT/ML ~~LOC~~ SOPN: 8.0000 [IU] | PEN_INJECTOR | Freq: Three times a day (TID) | SUBCUTANEOUS | 11 refills | Status: DC

## 2022-05-26 MED ORDER — INSULIN ASPART 100 UNIT/ML IJ SOLN: 8.0000 [IU] | Freq: Three times a day (TID) | INTRAMUSCULAR | 12 refills | Status: DC

## 2022-05-26 MED ORDER — DEXCOM G6 TRANSMITTER MISC: 1.0000 | 4 refills | Status: DC

## 2022-05-26 NOTE — Progress Notes (Signed)
Yolanda Martinez is here today for a routine prenatal visit with Dr. Macon Large. She has been seeing the Diabetes Educator about T2DM management. She is currently taking metformin 1000mg  BID. Her FBG are primarily at goal </= 95 mg/dL; however, PPBG tend to be elevated above goal </= 120 mg/dL. Since her last visit, she only has blood glucose log for Friday 4/5 with FBG 67, lunch PPBG 145, and dinner PPBG 145 which is fairly representative of overall blood glucose control.  Patient denies any signs/symptoms of hypoglycemia with 67 mg/dL. Spoke with patient that she is continuing to try to increase water intake and incorporate some of the vegetables and yogurt she discussed with Marylene Land.  I would recommend patient be started on Novolog 8 units TIDWM. However, will instruct patient is FBG < 70 mg/dL to skip breakfast dose. Given the patient's fasting blood glucose is relatively within range, I would not recommend long-acting insulin at this time. I counseled patient on how to use Novolog insulin pen, side effects, disposal, and administration. She is able to teach-back the instructions and is amenable to plan. Instructed patient to call with any questions.  Yolanda Martinez also expressed interest in Dexcom G6 cgm. Will enter orders to start PA process for those today.   Thank you for allowing pharmacy to be a part of this patient's care.  [place] Ponder, PharmD, BCPPS

## 2022-05-26 NOTE — Progress Notes (Signed)
PRENATAL VISIT NOTE  Subjective:  Yolanda Martinez is a 27 y.o. G2P1001 at 5736w2d being seen today for ongoing prenatal care.  She is currently monitored for the following issues for this high-risk pregnancy and has History of cesarean section; Type 2 diabetes mellitus; Type 2 diabetes mellitus during pregnancy; Supervision of other high risk pregnancy, antepartum; and Anemia during pregnancy in second trimester on their problem list.  Patient reports no complaints.  Contractions: Not present. Vag. Bleeding: None.  Movement: Present. Denies leaking of fluid.   The following portions of the patient's history were reviewed and updated as appropriate: allergies, current medications, past family history, past medical history, past social history, past surgical history and problem list.   Objective:   Vitals:   05/26/22 1100  BP: 134/78  Pulse: (!) 121  Weight: 212 lb (96.2 kg)    Fetal Status: Fetal Heart Rate (bpm): 144   Movement: Present     General:  Alert, oriented and cooperative. Patient is in no acute distress.  Skin: Skin is warm and dry. No rash noted.   Cardiovascular: Normal heart rate noted  Respiratory: Normal respiratory effort, no problems with respiration noted  Abdomen: Soft, gravid, appropriate for gestational age.  Pain/Pressure: Present     Pelvic: Cervical exam deferred        Extremities: Normal range of motion.  Edema: None  Mental Status: Normal mood and affect. Normal behavior. Normal judgment and thought content.   Imaging: US MFM OB FOLLOW UP  Result Date: 05/13/2022 ----------------------------------------------------------------------  OBSTETRICS REPORT                        (Signed Final 05/13/2022 09:30 am) ---------------------------------------------------------------------- Patient Info  ID #:       [id number]                          D.O.B.:  March 03, 1995 (26 yrs)  Name:       Yolanda Martinez             Visit Date: 05/13/2022 08:39 am  ---------------------------------------------------------------------- Performed By  Attending:        Lin Landsmanorenthian Booker      Ref. Address:      [address]                    MD  Performed By:     Reinaldo RaddleBrenda Shaw            [facility]                                                              [facility]  Referred By:      Venora Maples MD ---------------------------------------------------------------------- Orders  #  Description                           Code        Ordered By  1  [place] MFM OB FOLLOW UP                   [phone]    YU FANG ----------------------------------------------------------------------  #  Order #                     Accession #                Episode #  1  [id number]                   [id number]                 [id number] ---------------------------------------------------------------------- Indications  Pre-existing diabetes, type 2, in pregnancy,    O24.112  second trimester  Obesity complicating pregnancy, second          O99.212  trimester (BMI 38)  Antenatal follow-up for nonvisualized fetal     Z36.2  anatomy  History of cesarean delivery, currently         O34.219  pregnant  [redacted] weeks gestation of pregnancy                 Z3A.27  LR NIPS, Neg Horizon ---------------------------------------------------------------------- Fetal Evaluation  Num Of Fetuses:          1  Fetal Heart Rate(bpm):   140  Cardiac Activity:        Observed  Presentation:            Breech  Placenta:                Anterior  P. Cord Insertion:       Visualized, central  Amniotic Fluid  AFI FV:      Within normal limits  AFI Sum(cm)     %Tile       Largest Pocket(cm)  16.05           58          4.68  RUQ(cm)       RLQ(cm)       LUQ(cm)        LLQ(cm)  4.68          3.56          3.41           4.4  ---------------------------------------------------------------------- Biometry  BPD:      64.8  mm     G. Age:  26w 1d          8  %    CI:        69.94   %    70 - 86                                                          FL/HC:       20.0  %    18.6 -  20.4  HC:      247.2  mm     G. Age:  26w 6d          9  %    HC/AC:       1.10       1.05 - 1.21  AC:      224.5  mm     G. Age:  26w 6d         25  %    FL/BPD:      76.2  %    71 - 87  FL:       49.4  mm     G. Age:  26w 5d         16  %    FL/AC:       22.0  %    20 - 24  HUM:        46  mm     G. Age:  27w 1d         40  %  Est. FW:     977   gm     2 lb 2 oz     16  % ---------------------------------------------------------------------- OB History  Gravidity:    2         Term:   1  Living:       1 ---------------------------------------------------------------------- Gestational Age  LMP:           27w 3d        Date:  11/02/21                  EDD:   08/09/22  U/S Today:     26w 5d                                        EDD:   08/14/22  Best:          27w 3d     Det. By:  LMP  (11/02/21)          EDD:   08/09/22 ---------------------------------------------------------------------- Anatomy  Cranium:               Appears normal         LVOT:                   Previously seen  Cavum:                 Appears normal         Aortic Arch:            Previously seen  Ventricles:            Previously seen        Ductal Arch:            Previously seen  Choroid Plexus:        Previously seen        Diaphragm:              Appears normal  Cerebellum:            Previously seen        Stomach:                Appears normal, left  sided  Posterior Fossa:       Previously seen        Abdomen:                Previously seen  Nuchal Fold:           Previously seen        Abdominal Wall:         Previously seen  Face:                  Orbits previously      Cord Vessels:           Previously seen                          seen, profile NWV  Lips:                  Appears normal         Kidneys:                Appear normal  Palate:                Not well visualized    Bladder:                Appears normal  Thoracic:              Previously seen        Spine:                  Previously seen  Heart:                 Appears normal         Upper Extremities:      Previously seen                         (4CH, axis, and                         situs)  RVOT:                  Appears normal         Lower Extremities:      Previously seen  Other:  Heels/feet and hands prev visualized. Nasal bone, lenses, maxilla,          mandible and falx prev visualized. 3VV and 3VTV prev visualized.          Technically difficult due to maternal habitus and fetal position. ---------------------------------------------------------------------- Cervix Uterus Adnexa  Cervix  Not visualized (advanced GA >24wks)  Uterus  No abnormality visualized.  Right Ovary  Not visualized.  Left Ovary  Not visualized.  Cul De Sac  No free fluid seen.  Adnexa  No adnexal mass visualized ---------------------------------------------------------------------- Impression  Follow up growth due to T2DM and to incomplete anatomy.  Normal interval growth with measurements consistent with  dates  Good fetal movement and amniotic fluid volume  Suboptimal views of the fetal profile was observed.  Ms. Rottman has a normal fetal echocardiogram.  She reports that her blood sugars are controlled and she has  no questions. ---------------------------------------------------------------------- Recommendations  Continue serial growth every 4-6 weeks  Initiate weekly testing at 32 weeks. ----------------------------------------------------------------------              Lin Landsman, MD Electronically Signed Final Report   05/13/2022 09:30 am ----------------------------------------------------------------------  Assessment and Plan:  Pregnancy: G2P1001 at  [redacted]w[redacted]d 1. Pregnancy with type 2 diabetes mellitus in third trimester Reviewed CBGs, normal fastings but elevated PPs across the board.  With the help of [place] Ponder, RPh; we calculated insulin needs and prescribed Novolog 8 units tid with meals.  Patient also prescribed for Dexcom. Will monitor CBGs and adjust regimen accordingly. Already scheduled for scans and antenatal testing as per MFM, will follow up their recommendations. - insulin aspart (NOVOLOG FLEXPEN) 100 UNIT/ML FlexPen; Inject 8 Units into the skin 3 (three) times daily with meals.  Dispense: 15 mL; Refill: 11 - Insulin Pen Needle (PEN NEEDLES) 32G X 5 MM MISC; Use with insulin pen.  Dispense: 100 each; Refill: 11 - Continuous Blood Gluc Sensor (DEXCOM G6 SENSOR) MISC; Use 1 sensor every 10 days.  Dispense: 3 each; Refill: 11 - Continuous Blood Gluc Transmit (DEXCOM G6 TRANSMITTER) MISC; 1 each by Does not apply route every 3 (three) months.  Dispense: 1 each; Refill: 4  2. History of cesarean section Already consented for TOLAC  3. [redacted] weeks gestation of pregnancy 4. Supervision of other high risk pregnancy, antepartum Preterm labor symptoms and general obstetric precautions including but not limited to vaginal bleeding, contractions, leaking of fluid and fetal movement were reviewed in detail with the patient. Please refer to After Visit Summary for other counseling recommendations.   Return in about 2 weeks (around 06/09/2022).  Future Appointments  Date Time Provider Department Center  06/10/2022  9:15 AM [facility]  06/10/2022 10:30 AM [facility]SE [facility]  06/10/2022 10:45 AM [facility] US6 [facility]  06/12/2022  8:55 AM Warden Fillers, MD [facility]  06/17/2022 10:45 AM [facility] NURSE [facility]  06/17/2022 11:00 AM [facility] US1 [facility]  06/24/2022  8:30 AM [facility] NURSE [facility]  06/24/2022  8:45 AM [facility] US4 [facility]  07/01/2022 10:45 AM [facility] NURSE [facility]  07/01/2022 11:00 AM  [facility] US1 [facility]  07/08/2022  8:30 AM [facility] NURSE [facility]  07/08/2022  8:45 AM [facility] US4 [facility]    Jaynie Collins, MD

## 2022-05-26 NOTE — Telephone Encounter (Signed)
Prior authorization for Dexcom G6 sent to [facility] via fax. Notified patient she does not need to fill this until after insurance approval and Marylene Land, diabetes educator, will provide teaching on the Dexcom G6.   Thanks for allowing pharmacy to be a part of this patient's care.  Ar[place] Ponder, PharmD, BCPPS

## 2022-06-10 ENCOUNTER — Other Ambulatory Visit: Payer: Self-pay

## 2022-06-10 ENCOUNTER — Encounter: Payer: BC Managed Care – PPO | Admitting: Registered"

## 2022-06-10 ENCOUNTER — Ambulatory Visit (INDEPENDENT_AMBULATORY_CARE_PROVIDER_SITE_OTHER): Payer: BC Managed Care – PPO | Admitting: Registered"

## 2022-06-10 ENCOUNTER — Ambulatory Visit: Payer: BC Managed Care – PPO

## 2022-06-10 ENCOUNTER — Encounter: Payer: Self-pay | Admitting: Registered"

## 2022-06-10 ENCOUNTER — Ambulatory Visit: Payer: BC Managed Care – PPO | Attending: Maternal & Fetal Medicine

## 2022-06-10 VITALS — BP 120/60 | HR 105

## 2022-06-10 DIAGNOSIS — O99212 Obesity complicating pregnancy, second trimester: Secondary | ICD-10-CM | POA: Diagnosis present

## 2022-06-10 DIAGNOSIS — O24113 Pre-existing diabetes mellitus, type 2, in pregnancy, third trimester: Secondary | ICD-10-CM

## 2022-06-10 DIAGNOSIS — E669 Obesity, unspecified: Secondary | ICD-10-CM

## 2022-06-10 DIAGNOSIS — O99213 Obesity complicating pregnancy, third trimester: Secondary | ICD-10-CM

## 2022-06-10 DIAGNOSIS — O24112 Pre-existing diabetes mellitus, type 2, in pregnancy, second trimester: Secondary | ICD-10-CM | POA: Insufficient documentation

## 2022-06-10 DIAGNOSIS — O09899 Supervision of other high risk pregnancies, unspecified trimester: Secondary | ICD-10-CM | POA: Diagnosis not present

## 2022-06-10 DIAGNOSIS — Z7984 Long term (current) use of oral hypoglycemic drugs: Secondary | ICD-10-CM

## 2022-06-10 DIAGNOSIS — Z3A31 31 weeks gestation of pregnancy: Secondary | ICD-10-CM

## 2022-06-10 DIAGNOSIS — O34219 Maternal care for unspecified type scar from previous cesarean delivery: Secondary | ICD-10-CM | POA: Insufficient documentation

## 2022-06-10 DIAGNOSIS — E119 Type 2 diabetes mellitus without complications: Secondary | ICD-10-CM | POA: Diagnosis not present

## 2022-06-10 DIAGNOSIS — Z794 Long term (current) use of insulin: Secondary | ICD-10-CM

## 2022-06-10 NOTE — Progress Notes (Signed)
Patient seen for T2D in pregnancy self-management follow-up  Start time [phone] and End time 1000  Estimated due date: 08/09/2022; [redacted]w[redacted]d  Next ob visit 06/12/22  Clinical: Medications: Metformin 1000 bid, Dexcom G6, Novolog flexpen 8 u tid Medical History: T2D Labs: OGTT n/a, A1c 9.2% 09/06/21  Glucose log FBS WNL, PPBG excursions: 62 - 224 mg/dL    Assessment Pt called her pharmacy during visit and we were told that the Dexcom was not covered by her insurance. Pt continues to prick her finger to check blood sugar.   Pt states last Sunday she was at work and felt like her blood sugar might be low but didn't check it, also did not use insulin while at work. 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   Pt reports drinking sweetened beverages. Breakfast PPBG of 224 mg/dL Pt reports having poptart and juice. Pt states she injects 8 units of insulin when she starts eating. Advised patient to start injecting 10-15 min prior to meal.  Encouraged patient to avoid sweetened beverages and eat consistent carb meals to avoid post-meal excursions: 62-224 mg/dL  Exercise: ADL - walks a lot at work Sleep: not assessed this visit.  24-hr dietary recall: not assessed this visit   NUTRITION INTERVENTION  Nutrition education (E-1) on the following topics:  Basal & bolus insulin Consistent carb/protein in meals  Insulin pump option (omnipod)  Plan: We will work on getting supplies ordered for Omnipod insulin pump and Dexcom G6 CGM. If everything gets ordered and registered we will plan to start the insulin pump Tues, May 28  Goals: Continue working on eating vegetables daily and including more [place] yogurt, nuts, beans, greens. Avoid sweetened beverages. Aim to eat consistent carb meals so the 8 units of insulin will result in more consistent PPBG readings When the prescription for Omnipod is available, pick up from pharmacy and follow  directions to register product with [employer]. (Use handout provided today)  Patient will be seen for follow-up 07/15/22 for omnipod start

## 2022-06-12 ENCOUNTER — Ambulatory Visit (INDEPENDENT_AMBULATORY_CARE_PROVIDER_SITE_OTHER): Payer: BC Managed Care – PPO | Admitting: Obstetrics and Gynecology

## 2022-06-12 ENCOUNTER — Other Ambulatory Visit: Payer: Self-pay

## 2022-06-12 VITALS — BP 121/75 | HR 93 | Wt 212.6 lb

## 2022-06-12 DIAGNOSIS — Z98891 History of uterine scar from previous surgery: Secondary | ICD-10-CM

## 2022-06-12 DIAGNOSIS — O24113 Pre-existing diabetes mellitus, type 2, in pregnancy, third trimester: Secondary | ICD-10-CM

## 2022-06-12 DIAGNOSIS — Z3A31 31 weeks gestation of pregnancy: Secondary | ICD-10-CM

## 2022-06-12 DIAGNOSIS — O09893 Supervision of other high risk pregnancies, third trimester: Secondary | ICD-10-CM

## 2022-06-12 DIAGNOSIS — O09899 Supervision of other high risk pregnancies, unspecified trimester: Secondary | ICD-10-CM

## 2022-06-12 NOTE — Progress Notes (Signed)
Pt reports that her nose has been bleeding off & on for the past 2 weeks now.

## 2022-06-12 NOTE — Progress Notes (Signed)
FBS 78-104 PPBS: 127-175    PRENATAL VISIT NOTE  Subjective:  Yolanda Martinez is a 27 y.o. G2P1001 at [redacted]w[redacted]d being seen today for ongoing prenatal care.  She is currently monitored for the following issues for this high-risk pregnancy and has History of cesarean section; Type 2 diabetes mellitus; Type 2 diabetes mellitus during pregnancy; Supervision of other high risk pregnancy, antepartum; and Anemia during pregnancy in second trimester on their problem list.  Patient doing well with no acute concerns today. She reports no complaints.  Contractions: Not present. Vag. Bleeding: None.  Movement: Present. Denies leaking of fluid.     27 y.o. G2P1001 at [redacted]w[redacted]d with Estimated Date of Delivery: 08/09/22 was seen today in office to discuss trial of labor after cesarean section (TOLAC) versus elective repeat cesarean delivery (ERCD). The following risks were discussed with the patient.  Risk of uterine rupture at term is 0.78 percent with TOLAC and 0.22 percent with ERCD. 1 in 10 uterine ruptures will result in neonatal death or neurological injury. The benefits of a trial of labor after cesarean (TOLAC) resulting in a vaginal birth after cesarean (VBAC) include the following: shorter length of hospital stay and postpartum recovery (in most cases); fewer complications, such as postpartum fever, wound or uterine infection, thromboembolism (blood clots in the leg or lung), need for blood transfusion and fewer neonatal breathing problems. The risks of an attempted VBAC or TOLAC include the following: Risk of failed trial of labor after cesarean (TOLAC) without a vaginal birth after cesarean (VBAC) resulting in repeat cesarean delivery (RCD) in about 20 to 40 percent of women who attempt VBAC.  Risk of rupture of uterus resulting in an emergency cesarean delivery. The risk of uterine rupture may be related in part to the type of uterine incision made during the first cesarean delivery. A previous  transverse uterine incision has the lowest risk of rupture (0.2 to 1.5 percent risk). Vertical or T-shaped uterine incisions have a higher risk of uterine rupture (4 to 9 percent risk)The risk of fetal death is very low with both VBAC and elective repeat cesarean delivery (ERCD), but the likelihood of fetal death is higher with VBAC than with ERCD. Maternal death is very rare with either type of delivery. The risks of an elective repeat cesarean delivery (ERCD) were reviewed with the patient including but not limited to: 03/998 risk of uterine rupture which could have serious consequences, bleeding which may require transfusion; infection which may require antibiotics; injury to bowel, bladder or other surrounding organs (bowel, bladder, ureters); injury to the fetus; need for additional procedures including hysterectomy in the event of a life-threatening hemorrhage; thromboembolic phenomenon; abnormal placentation; incisional problems; death and other postoperative or anesthesia complications.    These risks and benefits are summarized on the consent form, which was reviewed with the patient during the visit.  All her questions answered and she signed a consent indicating a preference for TOLAC/ERCD. A copy of the consent was given to the patient.      The following portions of the patient's history were reviewed and updated as appropriate: allergies, current medications, past family history, past medical history, past social history, past surgical history and problem list. Problem list updated.  Objective:   Vitals:   06/12/22 0903  BP: 121/75  Pulse: 93  Weight: 212 lb 9.6 oz (96.4 kg)    Fetal Status: Fetal Heart Rate (bpm): 131 Fundal Height: 31 cm Movement: Present     General:  Alert, oriented and  cooperative. Patient is in no acute distress.  Skin: Skin is warm and dry. No rash noted.   Cardiovascular: Normal heart rate noted  Respiratory: Normal respiratory effort, no problems with  respiration noted  Abdomen: Soft, gravid, appropriate for gestational age.  Pain/Pressure: Present     Pelvic: Cervical exam deferred        Extremities: Normal range of motion.  Edema: None  Mental Status:  Normal mood and affect. Normal behavior. Normal judgment and thought content.   Assessment and Plan:  Pregnancy: G2P1001 at [redacted]w[redacted]d  1. Pregnancy with type 2 diabetes mellitus in third trimester FBS: 78-104 PPBS: 127-175 Multiple discussions with clinical dietician/diabetes specialist.  Pt receives 8 units of insulin with each meal, no long term insulin.  Concern would be increasing to 10 units /meal may increase hypoglycemic episodes.  Better dietary choices would go a long way and this was emphasized.  Team is also looking at possible sliding scale insulin post meal.  2. Supervision of other high risk pregnancy, antepartum Continue routine prenatal care  3. History of cesarean section Pt desires TOLAC, consent signed  Preterm labor symptoms and general obstetric precautions including but not limited to vaginal bleeding, contractions, leaking of fluid and fetal movement were reviewed in detail with the patient.  Please refer to After Visit Summary for other counseling recommendations.   Return in about 2 weeks (around 06/26/2022) for [facility], in person.   Mariel Aloe, MD Faculty Attending [facility]

## 2022-06-17 ENCOUNTER — Ambulatory Visit: Payer: BC Managed Care – PPO | Attending: Maternal & Fetal Medicine

## 2022-06-17 ENCOUNTER — Ambulatory Visit: Payer: BC Managed Care – PPO | Admitting: *Deleted

## 2022-06-17 VITALS — BP 118/65 | HR 111

## 2022-06-17 DIAGNOSIS — O24113 Pre-existing diabetes mellitus, type 2, in pregnancy, third trimester: Secondary | ICD-10-CM | POA: Diagnosis present

## 2022-06-17 DIAGNOSIS — O34219 Maternal care for unspecified type scar from previous cesarean delivery: Secondary | ICD-10-CM | POA: Insufficient documentation

## 2022-06-17 DIAGNOSIS — O99213 Obesity complicating pregnancy, third trimester: Secondary | ICD-10-CM

## 2022-06-17 DIAGNOSIS — E119 Type 2 diabetes mellitus without complications: Secondary | ICD-10-CM | POA: Diagnosis not present

## 2022-06-17 DIAGNOSIS — E669 Obesity, unspecified: Secondary | ICD-10-CM

## 2022-06-17 DIAGNOSIS — O24112 Pre-existing diabetes mellitus, type 2, in pregnancy, second trimester: Secondary | ICD-10-CM

## 2022-06-17 DIAGNOSIS — O99212 Obesity complicating pregnancy, second trimester: Secondary | ICD-10-CM | POA: Insufficient documentation

## 2022-06-17 DIAGNOSIS — Z3A32 32 weeks gestation of pregnancy: Secondary | ICD-10-CM

## 2022-06-24 ENCOUNTER — Ambulatory Visit: Payer: BC Managed Care – PPO | Attending: Maternal & Fetal Medicine

## 2022-06-24 ENCOUNTER — Other Ambulatory Visit: Payer: Self-pay | Admitting: *Deleted

## 2022-06-24 ENCOUNTER — Ambulatory Visit: Payer: BC Managed Care – PPO | Admitting: *Deleted

## 2022-06-24 VITALS — BP 113/64 | HR 77

## 2022-06-24 DIAGNOSIS — O99213 Obesity complicating pregnancy, third trimester: Secondary | ICD-10-CM | POA: Diagnosis not present

## 2022-06-24 DIAGNOSIS — O24013 Pre-existing diabetes mellitus, type 1, in pregnancy, third trimester: Secondary | ICD-10-CM | POA: Insufficient documentation

## 2022-06-24 DIAGNOSIS — O34219 Maternal care for unspecified type scar from previous cesarean delivery: Secondary | ICD-10-CM | POA: Diagnosis present

## 2022-06-24 DIAGNOSIS — Z3A33 33 weeks gestation of pregnancy: Secondary | ICD-10-CM

## 2022-06-24 DIAGNOSIS — E119 Type 2 diabetes mellitus without complications: Secondary | ICD-10-CM

## 2022-06-24 DIAGNOSIS — O99212 Obesity complicating pregnancy, second trimester: Secondary | ICD-10-CM | POA: Diagnosis present

## 2022-06-24 DIAGNOSIS — Z794 Long term (current) use of insulin: Secondary | ICD-10-CM

## 2022-06-24 DIAGNOSIS — O24113 Pre-existing diabetes mellitus, type 2, in pregnancy, third trimester: Secondary | ICD-10-CM

## 2022-06-24 DIAGNOSIS — O24112 Pre-existing diabetes mellitus, type 2, in pregnancy, second trimester: Secondary | ICD-10-CM | POA: Insufficient documentation

## 2022-06-24 DIAGNOSIS — E669 Obesity, unspecified: Secondary | ICD-10-CM

## 2022-06-26 ENCOUNTER — Ambulatory Visit (INDEPENDENT_AMBULATORY_CARE_PROVIDER_SITE_OTHER): Payer: BC Managed Care – PPO | Admitting: Obstetrics and Gynecology

## 2022-06-26 ENCOUNTER — Other Ambulatory Visit: Payer: Self-pay

## 2022-06-26 VITALS — BP 114/64 | HR 105 | Wt 210.1 lb

## 2022-06-26 DIAGNOSIS — Z3A33 33 weeks gestation of pregnancy: Secondary | ICD-10-CM

## 2022-06-26 DIAGNOSIS — O24113 Pre-existing diabetes mellitus, type 2, in pregnancy, third trimester: Secondary | ICD-10-CM

## 2022-06-26 DIAGNOSIS — O99012 Anemia complicating pregnancy, second trimester: Secondary | ICD-10-CM

## 2022-06-26 DIAGNOSIS — Z98891 History of uterine scar from previous surgery: Secondary | ICD-10-CM

## 2022-06-26 MED ORDER — NOVOLOG FLEXPEN 100 UNIT/ML ~~LOC~~ SOPN: 8.0000 [IU] | PEN_INJECTOR | Freq: Three times a day (TID) | SUBCUTANEOUS | 11 refills | Status: DC

## 2022-06-26 NOTE — Progress Notes (Addendum)
    PRENATAL VISIT NOTE  Subjective:  Yolanda Martinez is a 27 y.o. G2P1001 at [redacted]w[redacted]d being seen today for ongoing prenatal care.  She is currently monitored for the following issues for this high-risk pregnancy and has History of cesarean section; Type 2 diabetes mellitus (HCC); Type 2 diabetes mellitus during pregnancy; Supervision of other high risk pregnancy, antepartum; and Anemia during pregnancy in second trimester on their problem list.  Patient reports no complaints.  Contractions: Not present. Vag. Bleeding: None.  Movement: Present. Denies leaking of fluid.   The following portions of the patient's history were reviewed and updated as appropriate: allergies, current medications, past family history, past medical history, past social history, past surgical history and problem list.   Objective:   Vitals:   06/26/22 1606  BP: 114/64  Pulse: (!) 105  Weight: 210 lb 1.6 oz (95.3 kg)    Fetal Status: Fetal Heart Rate (bpm): 144   Movement: Present     General:  Alert, oriented and cooperative. Patient is in no acute distress.  Skin: Skin is warm and dry. No rash noted.   Cardiovascular: Normal heart rate noted  Respiratory: Normal respiratory effort, no problems with respiration noted  Abdomen: Soft, gravid, appropriate for gestational age.  Pain/Pressure: Absent     Pelvic: Cervical exam deferred        Extremities: Normal range of motion.  Edema: Trace (in the morning)  Mental Status: Normal mood and affect. Normal behavior. Normal judgment and thought content.   Assessment and Plan:  Pregnancy: G2P1001 at [redacted]w[redacted]d 1. [redacted] weeks gestation of pregnancy Consider GBS swab next visit Follow up re: birth control next visit  2. History of cesarean section Tolac consent already signed  3. Pregnancy with type 2 diabetes mellitus in third trimester Pt currently on meal coverage insulin only with aspart 8/8/8 along with metformin 1000 w/ breakfast and 1000 with dinner . AM  fastings normal with elevated 2h lunch and dinner in the 120s-140s. I told her that I'm leaning towards adding intermediate/long insulin with breakfast but can see how going up to 10u with lunch and dinner are.  Likely needs delivery at 37wks. Continue qwk testing 5/7: ceph, 8/8, afi 15 4/23: 35%, 1747g, ac 71%, afi 16 -fetal echo wnl  - insulin aspart (NOVOLOG FLEXPEN) 100 UNIT/ML FlexPen; Inject 8 Units into the skin 3 (three) times daily with meals. 10u with breakfast, 10u with lunch, 8u with dinner  Dispense: 15 mL; Refill: 11  4. Anemia during pregnancy in second trimester Continue PO iron   Preterm labor symptoms and general obstetric precautions including but not limited to vaginal bleeding, contractions, leaking of fluid and fetal movement were reviewed in detail with the patient. Please refer to After Visit Summary for other counseling recommendations.   Return in about 5 days (around 07/01/2022).  Future Appointments  Date Time Provider Department Center  07/01/2022 10:45 AM [facility] NURSE [facility]  07/01/2022 11:00 AM [facility] US1 [facility]  07/08/2022  8:30 AM [facility] NURSE [facility]  07/08/2022  8:45 AM [facility] US4 [facility]  07/15/2022  8:30 AM [facility] NURSE [facility]  07/15/2022  8:45 AM [facility] NST [facility]  07/15/2022 10:15 AM [facility]ED[facility]ATION [facility]  07/22/2022  9:30 AM [facility] NURSE [facility]  07/22/2022  9:45 AM [facility] NST [facility]  07/28/2022 10:00 AM Motwani, Komal, MD [facility] None    Marshall Bing, MD

## 2022-07-01 ENCOUNTER — Ambulatory Visit: Payer: BC Managed Care – PPO | Admitting: *Deleted

## 2022-07-01 ENCOUNTER — Other Ambulatory Visit (HOSPITAL_COMMUNITY)
Admission: RE | Admit: 2022-07-01 | Discharge: 2022-07-01 | Disposition: A | Discharge status: Home / Self Care | Payer: BC Managed Care – PPO | Source: Ambulatory Visit | Attending: Obstetrics and Gynecology | Admitting: Obstetrics and Gynecology

## 2022-07-01 ENCOUNTER — Encounter: Payer: Self-pay | Admitting: Obstetrics and Gynecology

## 2022-07-01 ENCOUNTER — Ambulatory Visit (INDEPENDENT_AMBULATORY_CARE_PROVIDER_SITE_OTHER): Payer: BC Managed Care – PPO | Admitting: Obstetrics and Gynecology

## 2022-07-01 ENCOUNTER — Ambulatory Visit (HOSPITAL_BASED_OUTPATIENT_CLINIC_OR_DEPARTMENT_OTHER): Payer: BC Managed Care – PPO

## 2022-07-01 ENCOUNTER — Encounter: Payer: Self-pay | Admitting: Family Medicine

## 2022-07-01 ENCOUNTER — Other Ambulatory Visit: Payer: Self-pay

## 2022-07-01 VITALS — BP 122/67 | HR 114

## 2022-07-01 VITALS — BP 116/73 | HR 111 | Wt 211.0 lb

## 2022-07-01 DIAGNOSIS — O24112 Pre-existing diabetes mellitus, type 2, in pregnancy, second trimester: Secondary | ICD-10-CM | POA: Insufficient documentation

## 2022-07-01 DIAGNOSIS — Z3A34 34 weeks gestation of pregnancy: Secondary | ICD-10-CM

## 2022-07-01 DIAGNOSIS — O09899 Supervision of other high risk pregnancies, unspecified trimester: Secondary | ICD-10-CM | POA: Insufficient documentation

## 2022-07-01 DIAGNOSIS — E119 Type 2 diabetes mellitus without complications: Secondary | ICD-10-CM

## 2022-07-01 DIAGNOSIS — O99213 Obesity complicating pregnancy, third trimester: Secondary | ICD-10-CM | POA: Diagnosis not present

## 2022-07-01 DIAGNOSIS — O99212 Obesity complicating pregnancy, second trimester: Secondary | ICD-10-CM

## 2022-07-01 DIAGNOSIS — O34219 Maternal care for unspecified type scar from previous cesarean delivery: Secondary | ICD-10-CM

## 2022-07-01 DIAGNOSIS — O24113 Pre-existing diabetes mellitus, type 2, in pregnancy, third trimester: Secondary | ICD-10-CM | POA: Insufficient documentation

## 2022-07-01 DIAGNOSIS — E669 Obesity, unspecified: Secondary | ICD-10-CM

## 2022-07-01 MED ORDER — INSULIN GLARGINE 100 UNIT/ML SOLOSTAR PEN: 5.0000 [IU] | PEN_INJECTOR | Freq: Every day | SUBCUTANEOUS | 1 refills | Status: DC

## 2022-07-01 NOTE — Patient Instructions (Signed)
Your induction is scheduled for Friday, June 7th at 1145 at night

## 2022-07-01 NOTE — Progress Notes (Signed)
    PRENATAL VISIT NOTE  Subjective:  Yolanda Martinez is a 27 y.o. G2P1001 at [redacted]w[redacted]d being seen today for ongoing prenatal care.  She is currently monitored for the following issues for this high-risk pregnancy and has History of cesarean section; Type 2 diabetes mellitus (HCC); Type 2 diabetes mellitus during pregnancy; Supervision of other high risk pregnancy, antepartum; and Anemia during pregnancy in second trimester on their problem list.  Patient reports no complaints.  Contractions: Not present. Vag. Bleeding: None.  Movement: Present. Denies leaking of fluid.   The following portions of the patient's history were reviewed and updated as appropriate: allergies, current medications, past family history, past medical history, past social history, past surgical history and problem list.   Objective:   Vitals:   07/01/22 1014 07/01/22 1017  BP:  116/73  Pulse:  (!) 111  Weight: 211 lb (95.7 kg) 211 lb (95.7 kg)    Fetal Status: Fetal Heart Rate (bpm): 133   Movement: Present     General:  Alert, oriented and cooperative. Patient is in no acute distress.  Skin: Skin is warm and dry. No rash noted.   Cardiovascular: Normal heart rate noted  Respiratory: Normal respiratory effort, no problems with respiration noted  Abdomen: Soft, gravid, appropriate for gestational age.  Pain/Pressure: Present     Pelvic: Cervical exam deferred        Extremities: Normal range of motion.  Edema: None  Mental Status: Normal mood and affect. Normal behavior. Normal judgment and thought content.   Assessment and Plan:  Pregnancy: G2P1001 at [redacted]w[redacted]d 1. [redacted] weeks gestation of pregnancy GBS done today   2. History of cesarean section [place] consent already signed   3. Pregnancy with type 2 diabetes mellitus in third trimester Pt currently on metfromin 1000 bid ac meal coverage insulin only with aspart which was increased from 8 tidac to 10/10/8 last week. AM fastings normal with elevated 2h lunch  and dinner in the 130s-140s. Recommended to her to start intermediate/long acting insulin which she is amenable to. Will start lantus 5u with breakfast and keep her aspart at 10/10/8 along with continuing the metformin 1000 bidac.  Follow up weekly bpp today Pt set up for 6/7 at 1145pm IOL 5/7: ceph, 8/8, afi 15 4/23: 35%, 1747g, ac 71%, afi 16 -fetal echo wnl   4. Anemia during pregnancy in second trimester Continue PO iron   Preterm labor symptoms and general obstetric precautions including but not limited to vaginal bleeding, contractions, leaking of fluid and fetal movement were reviewed in detail with the patient. Please refer to After Visit Summary for other counseling recommendations.   No follow-ups on file.  Future Appointments  Date Time Provider Department Center  07/01/2022 11:00 AM [facility] US1 [facility]  07/08/2022  8:30 AM [facility] NURSE [facility]  07/08/2022  8:45 AM [facility] US4 [facility]  07/08/2022 10:35 AM Venora Maples, MD [facility]  07/15/2022  8:30 AM [facility] NURSE [facility]  07/15/2022  8:45 AM [facility] NST [facility]  07/15/2022 10:15 AM W[facility]-EDUCATION [facility]  07/15/2022  3:55 PM Venora Maples, MD [facility]  07/22/2022  9:30 AM [facility] NURSE [facility]  07/22/2022  9:45 AM [facility] NST [facility]  07/26/2022 12:00 AM MC-LD SCHED ROOM MC-INDC None  07/28/2022 10:00 AM Alt[place]e [place], MD [facility] None  09/29/2022 10:45 AM Early, Sung Amabile, NP PFM-PFM PF[facility]    Hoven Bing, MD

## 2022-07-02 LAB — GC/CHLAMYDIA PROBE AMP (~~LOC~~) NOT AT ARMC
Chlamydia: NEGATIVE
Comment: NEGATIVE
Comment: NORMAL
Neisseria Gonorrhea: NEGATIVE

## 2022-07-05 LAB — CULTURE, BETA STREP (GROUP B ONLY): Strep Gp B Culture: NEGATIVE

## 2022-07-08 ENCOUNTER — Ambulatory Visit (HOSPITAL_BASED_OUTPATIENT_CLINIC_OR_DEPARTMENT_OTHER): Payer: BC Managed Care – PPO

## 2022-07-08 ENCOUNTER — Other Ambulatory Visit (HOSPITAL_COMMUNITY)
Admission: RE | Admit: 2022-07-08 | Discharge: 2022-07-08 | Disposition: A | Discharge status: Home / Self Care | Payer: BC Managed Care – PPO | Source: Ambulatory Visit | Attending: Family Medicine | Admitting: Family Medicine

## 2022-07-08 ENCOUNTER — Ambulatory Visit: Payer: BC Managed Care – PPO | Admitting: Family Medicine

## 2022-07-08 ENCOUNTER — Ambulatory Visit: Payer: BC Managed Care – PPO | Admitting: *Deleted

## 2022-07-08 VITALS — BP 120/78 | HR 96 | Wt 211.0 lb

## 2022-07-08 VITALS — BP 117/67 | HR 93

## 2022-07-08 DIAGNOSIS — O34219 Maternal care for unspecified type scar from previous cesarean delivery: Secondary | ICD-10-CM

## 2022-07-08 DIAGNOSIS — O24113 Pre-existing diabetes mellitus, type 2, in pregnancy, third trimester: Secondary | ICD-10-CM | POA: Insufficient documentation

## 2022-07-08 DIAGNOSIS — Z794 Long term (current) use of insulin: Secondary | ICD-10-CM | POA: Diagnosis not present

## 2022-07-08 DIAGNOSIS — O24112 Pre-existing diabetes mellitus, type 2, in pregnancy, second trimester: Secondary | ICD-10-CM | POA: Insufficient documentation

## 2022-07-08 DIAGNOSIS — O09899 Supervision of other high risk pregnancies, unspecified trimester: Secondary | ICD-10-CM

## 2022-07-08 DIAGNOSIS — O99212 Obesity complicating pregnancy, second trimester: Secondary | ICD-10-CM

## 2022-07-08 DIAGNOSIS — N898 Other specified noninflammatory disorders of vagina: Secondary | ICD-10-CM | POA: Diagnosis present

## 2022-07-08 DIAGNOSIS — Z98891 History of uterine scar from previous surgery: Secondary | ICD-10-CM

## 2022-07-08 DIAGNOSIS — Z3A35 35 weeks gestation of pregnancy: Secondary | ICD-10-CM

## 2022-07-08 DIAGNOSIS — O09893 Supervision of other high risk pregnancies, third trimester: Secondary | ICD-10-CM

## 2022-07-08 DIAGNOSIS — E669 Obesity, unspecified: Secondary | ICD-10-CM

## 2022-07-08 NOTE — Progress Notes (Signed)
   Subjective:  Yolanda Martinez is a 27 y.o. G2P1001 at [redacted]w[redacted]d being seen today for ongoing prenatal care.  She is currently monitored for the following issues for this high-risk pregnancy and has History of cesarean section; Type 2 diabetes mellitus (HCC); Type 2 diabetes mellitus during pregnancy; Supervision of other high risk pregnancy, antepartum; and Anemia during pregnancy in second trimester on their problem list.  Patient reports  brownish vaginal discharge .  Contractions: Not present. Vag. Bleeding: None.  Movement: Present. Denies leaking of fluid.   The following portions of the patient's history were reviewed and updated as appropriate: allergies, current medications, past family history, past medical history, past social history, past surgical history and problem list. Problem list updated.  Objective:   Vitals:   07/08/22 1124  BP: 120/78  Pulse: 96  Weight: 211 lb (95.7 kg)    Fetal Status: Fetal Heart Rate (bpm): 140   Movement: Present     General:  Alert, oriented and cooperative. Patient is in no acute distress.  Skin: Skin is warm and dry. No rash noted.   Cardiovascular: Normal heart rate noted  Respiratory: Normal respiratory effort, no problems with respiration noted  Abdomen: Soft, gravid, appropriate for gestational age. Pain/Pressure: Present     Pelvic: Vag. Bleeding: None     Cervical exam deferred        Extremities: Normal range of motion.  Edema: None  Mental Status: Normal mood and affect. Normal behavior. Normal judgment and thought content.   Urinalysis:      Assessment and Plan:  Pregnancy: G2P1001 at [redacted]w[redacted]d  1. Vaginal odor Self swab - Cervicovaginal ancillary only( [place])  2. Supervision of other high risk pregnancy, antepartum BP and FHR normal Swabs already collected  3. Pregnancy with type 2 diabetes mellitus in third trimester Taking baby ASA On Lantus 5u qAM, and aspart 10/10/8 However not taking AM aspart as she didn't  understand she needed both together Log with borderline control, on review appears that elevated values are primarily driven by dietary indiscrection Already scheduled for IOL at 38 weeks Growth US done earlier today, EFW 48%, 2671g, AC 85%, AFI 13 Fetal echo was wnl on 05/02/2022  4. History of cesarean section Desires TOLAC  Preterm labor symptoms and general obstetric precautions including but not limited to vaginal bleeding, contractions, leaking of fluid and fetal movement were reviewed in detail with the patient. Please refer to After Visit Summary for other counseling recommendations.  Return in 1 week (on 07/15/2022) for [facility], ob visit.   Venora Maples, MD

## 2022-07-08 NOTE — Patient Instructions (Signed)

## 2022-07-09 LAB — CERVICOVAGINAL ANCILLARY ONLY
Bacterial Vaginitis (gardnerella): NEGATIVE
Comment: NEGATIVE
Comment: NEGATIVE
Trichomonas: NEGATIVE

## 2022-07-15 ENCOUNTER — Encounter: Payer: Self-pay | Admitting: *Deleted

## 2022-07-15 ENCOUNTER — Ambulatory Visit: Payer: BC Managed Care – PPO | Admitting: Family Medicine

## 2022-07-15 ENCOUNTER — Other Ambulatory Visit: Payer: Self-pay

## 2022-07-15 ENCOUNTER — Encounter: Payer: BC Managed Care – PPO | Attending: Family Medicine | Admitting: Registered"

## 2022-07-15 ENCOUNTER — Ambulatory Visit: Payer: BC Managed Care – PPO | Admitting: *Deleted

## 2022-07-15 ENCOUNTER — Ambulatory Visit: Payer: BC Managed Care – PPO | Attending: Maternal & Fetal Medicine | Admitting: *Deleted

## 2022-07-15 ENCOUNTER — Encounter: Payer: Self-pay | Admitting: Family Medicine

## 2022-07-15 ENCOUNTER — Ambulatory Visit (INDEPENDENT_AMBULATORY_CARE_PROVIDER_SITE_OTHER): Payer: BC Managed Care – PPO | Admitting: Registered"

## 2022-07-15 VITALS — BP 131/79 | HR 86 | Wt 213.0 lb

## 2022-07-15 DIAGNOSIS — O24113 Pre-existing diabetes mellitus, type 2, in pregnancy, third trimester: Secondary | ICD-10-CM

## 2022-07-15 DIAGNOSIS — Z3A36 36 weeks gestation of pregnancy: Secondary | ICD-10-CM | POA: Insufficient documentation

## 2022-07-15 DIAGNOSIS — O09899 Supervision of other high risk pregnancies, unspecified trimester: Secondary | ICD-10-CM | POA: Insufficient documentation

## 2022-07-15 DIAGNOSIS — O99012 Anemia complicating pregnancy, second trimester: Secondary | ICD-10-CM

## 2022-07-15 DIAGNOSIS — O24111 Pre-existing diabetes mellitus, type 2, in pregnancy, first trimester: Secondary | ICD-10-CM | POA: Diagnosis not present

## 2022-07-15 DIAGNOSIS — Z3A11 11 weeks gestation of pregnancy: Secondary | ICD-10-CM | POA: Insufficient documentation

## 2022-07-15 DIAGNOSIS — Z98891 History of uterine scar from previous surgery: Secondary | ICD-10-CM

## 2022-07-15 DIAGNOSIS — Z7984 Long term (current) use of oral hypoglycemic drugs: Secondary | ICD-10-CM | POA: Insufficient documentation

## 2022-07-15 DIAGNOSIS — O09893 Supervision of other high risk pregnancies, third trimester: Secondary | ICD-10-CM

## 2022-07-15 DIAGNOSIS — O99013 Anemia complicating pregnancy, third trimester: Secondary | ICD-10-CM

## 2022-07-15 NOTE — Progress Notes (Signed)
Patient seen for T2D in pregnancy self-management follow-up Original plan was to get Omnipod started, but not enough time left in pregnancy.  Start time 1030 and End time 1109  Estimated due date: 08/09/2022 - Pt states she is going to be induced 07/29/22; [redacted]w[redacted]d  Next ob visit this afternoon First Endocrinologist visit June 10 New PCP initial appt August 12  Clinical: Medications: Metformin 1000 bid, Lantus 5 units with breakfast, aspart 10/10/8 Medical History: T2D Labs: OGTT n/a, A1c 9.2% 09/06/21  Glucose log FBS WNL, PPBG 67-153  Assessment Pt states she is working on increasing vegetables and reducing sweetened beverages. Pt states tries to make sure and eat breakfast.  Pt states she has endocrinologist appt the day before her delivery. Encouraged her to make sure she gets clear what medications she needs to take postpartum.  Exercise: ADL - walks a lot at work Sleep: not assessed this visit.  24-hr dietary recall: not assessed this visit   NUTRITION INTERVENTION  Nutrition education (E-1) on the following topics:  Postpartum care / change in insulin needs Role of Endocrinologist, PCP and diabetes educator Changes after delivery What to expect with future pregnancies  Pt has worked on Goals from last visit: Continue working on eating vegetables daily Making progress 50-75% of time reaching goal Include [place] yogurt and nuts for more protein, calcium, nutrient dense food  In Progress Avoid sweetened beverages Pt reports 40-50% improvement, getting there  Patient will be seen for follow-up as needed

## 2022-07-15 NOTE — Procedures (Signed)
Yolanda Martinez 04-20-1995 [redacted]w[redacted]d  Fetus A Non-Stress Test Interpretation for 07/15/22  Indication: Type 2 DM  Fetal Heart Rate A Mode: External Baseline Rate (A): 130 bpm Variability: Moderate Accelerations: 15 x 15 Decelerations: None Multiple birth?: No  Uterine Activity Mode: Palpation, Toco Contraction Frequency (min): Irreg w/UI Contraction Duration (sec): 10-100 Contraction Quality: Mild Resting Tone Palpated: Relaxed Resting Time: Adequate  Interpretation (Fetal Testing) Nonstress Test Interpretation: Reactive Comments: Dr. Judeth Cornfield reviewed tracing.

## 2022-07-15 NOTE — Patient Instructions (Signed)

## 2022-07-15 NOTE — Progress Notes (Unsigned)
   Subjective:  Yolanda Martinez is a 27 y.o. G2P1001 at [redacted]w[redacted]d being seen today for ongoing prenatal care.  She is currently monitored for the following issues for this high-risk pregnancy and has History of cesarean section; Type 2 diabetes mellitus (HCC); Type 2 diabetes mellitus during pregnancy; Supervision of other high risk pregnancy, antepartum; and Anemia during pregnancy in second trimester on their problem list.  Patient reports no complaints.  Contractions: Irritability. Vag. Bleeding: None.  Movement: Present. Denies leaking of fluid.   The following portions of the patient's history were reviewed and updated as appropriate: allergies, current medications, past family history, past medical history, past social history, past surgical history and problem list. Problem list updated.  Objective:   Vitals:   07/15/22 1615  BP: 131/79  Pulse: 86  Weight: 213 lb (96.6 kg)    Fetal Status: Fetal Heart Rate (bpm): 143   Movement: Present     General:  Alert, oriented and cooperative. Patient is in no acute distress.  Skin: Skin is warm and dry. No rash noted.   Cardiovascular: Normal heart rate noted  Respiratory: Normal respiratory effort, no problems with respiration noted  Abdomen: Soft, gravid, appropriate for gestational age. Pain/Pressure: Absent     Pelvic: Vag. Bleeding: None     Cervical exam deferred        Extremities: Normal range of motion.     Mental Status: Normal mood and affect. Normal behavior. Normal judgment and thought content.   Urinalysis:      Assessment and Plan:  Pregnancy: G2P1001 at [redacted]w[redacted]d  1. Supervision of other high risk pregnancy, antepartum BP and FHR normal  2. Pregnancy with type 2 diabetes mellitus in third trimester Log reviewed on phone, fastings are doing OK but majority of post prandials are ranging 120-150's Taking baby ASA On Lantus 5u qAM, and aspart 10/10/8 Last growth [place] 07/08/2022, EFW 48%, 2671g, AC 85%, AFI 13 Fetal echo  was wnl on 05/02/2022 Has BPP scheduled for later today Given deteriorating blood sugars, previously scheduled IOL moved up to 07/24/2022 AM  3. History of cesarean section Desires TOLAC, consent signed 05/05/2022  Preterm labor symptoms and general obstetric precautions including but not limited to vaginal bleeding, contractions, leaking of fluid and fetal movement were reviewed in detail with the patient. Please refer to After Visit Summary for other counseling recommendations.  Return in 1 week (on 07/22/2022) for [facility], ob visit.   Venora Maples, MD

## 2022-07-17 ENCOUNTER — Encounter: Payer: Self-pay | Admitting: Family Medicine

## 2022-07-18 ENCOUNTER — Telehealth (HOSPITAL_COMMUNITY): Payer: Self-pay | Admitting: *Deleted

## 2022-07-18 ENCOUNTER — Encounter (HOSPITAL_COMMUNITY): Payer: Self-pay

## 2022-07-18 NOTE — Telephone Encounter (Signed)
Preadmission screen  

## 2022-07-20 ENCOUNTER — Other Ambulatory Visit: Payer: Self-pay | Admitting: Advanced Practice Midwife

## 2022-07-20 DIAGNOSIS — E119 Type 2 diabetes mellitus without complications: Secondary | ICD-10-CM

## 2022-07-21 ENCOUNTER — Encounter (HOSPITAL_COMMUNITY): Payer: Self-pay | Admitting: *Deleted

## 2022-07-21 ENCOUNTER — Telehealth (HOSPITAL_COMMUNITY): Payer: Self-pay | Admitting: *Deleted

## 2022-07-21 ENCOUNTER — Other Ambulatory Visit: Payer: Self-pay

## 2022-07-21 DIAGNOSIS — O24113 Pre-existing diabetes mellitus, type 2, in pregnancy, third trimester: Secondary | ICD-10-CM

## 2022-07-21 MED ORDER — DEXCOM G6 TRANSMITTER MISC: 1.0000 | 4 refills | Status: DC

## 2022-07-21 NOTE — Telephone Encounter (Signed)
Preadmission screen  

## 2022-07-21 NOTE — Telephone Encounter (Signed)
Received fax from pharmacy requesting refill on Dexcom g6 transmitter.   Maureen Ralphs RN on 07/21/22 at 1501

## 2022-07-22 ENCOUNTER — Ambulatory Visit (INDEPENDENT_AMBULATORY_CARE_PROVIDER_SITE_OTHER): Payer: BC Managed Care – PPO | Admitting: Advanced Practice Midwife

## 2022-07-22 ENCOUNTER — Ambulatory Visit: Payer: BC Managed Care – PPO | Admitting: *Deleted

## 2022-07-22 ENCOUNTER — Ambulatory Visit: Payer: BC Managed Care – PPO | Attending: Maternal & Fetal Medicine | Admitting: *Deleted

## 2022-07-22 ENCOUNTER — Other Ambulatory Visit: Payer: Self-pay

## 2022-07-22 VITALS — BP 131/61 | HR 80

## 2022-07-22 VITALS — BP 118/69 | HR 88 | Wt 212.8 lb

## 2022-07-22 DIAGNOSIS — O24113 Pre-existing diabetes mellitus, type 2, in pregnancy, third trimester: Secondary | ICD-10-CM

## 2022-07-22 DIAGNOSIS — O09899 Supervision of other high risk pregnancies, unspecified trimester: Secondary | ICD-10-CM

## 2022-07-22 DIAGNOSIS — E119 Type 2 diabetes mellitus without complications: Secondary | ICD-10-CM

## 2022-07-22 DIAGNOSIS — Z98891 History of uterine scar from previous surgery: Secondary | ICD-10-CM

## 2022-07-22 DIAGNOSIS — Z3A37 37 weeks gestation of pregnancy: Secondary | ICD-10-CM

## 2022-07-22 DIAGNOSIS — O99012 Anemia complicating pregnancy, second trimester: Secondary | ICD-10-CM

## 2022-07-22 DIAGNOSIS — Z794 Long term (current) use of insulin: Secondary | ICD-10-CM | POA: Diagnosis not present

## 2022-07-22 DIAGNOSIS — O99213 Obesity complicating pregnancy, third trimester: Secondary | ICD-10-CM

## 2022-07-22 DIAGNOSIS — Z0289 Encounter for other administrative examinations: Secondary | ICD-10-CM

## 2022-07-22 DIAGNOSIS — Z7984 Long term (current) use of oral hypoglycemic drugs: Secondary | ICD-10-CM | POA: Insufficient documentation

## 2022-07-22 DIAGNOSIS — O09893 Supervision of other high risk pregnancies, third trimester: Secondary | ICD-10-CM

## 2022-07-22 DIAGNOSIS — O99013 Anemia complicating pregnancy, third trimester: Secondary | ICD-10-CM

## 2022-07-22 NOTE — Patient Instructions (Addendum)
Increase lunch insulin to 11 Units. If still greater that 120, increase to 12 Units with lunch.

## 2022-07-22 NOTE — Procedures (Signed)
Yolanda Martinez [birth year] [redacted]w[redacted]d  Fetus A Non-Stress Test Interpretation for 07/22/22  Indication: Diabetes and obese  Fetal Heart Rate A Mode: External Baseline Rate (A): 130 bpm Variability: Moderate Accelerations: 15 x 15 Decelerations: None Multiple birth?: No  Uterine Activity Mode: Toco Contraction Frequency (min): none Resting Tone Palpated: Relaxed  Interpretation (Fetal Testing) Nonstress Test Interpretation: Reactive Overall Impression: Reassuring for gestational age Comments: Tracing reviewedby Dr. Darra Lis

## 2022-07-22 NOTE — Progress Notes (Unsigned)
   PRENATAL VISIT NOTE  Subjective:  Yolanda Martinez is a 27 y.o. G2P1001 at [redacted]w[redacted]d being seen today for ongoing prenatal care.  She is currently monitored for the following issues for this {Blank single:19197::"high-risk","low-risk"} pregnancy and has History of cesarean section; Type 2 diabetes mellitus (HCC); Type 2 diabetes mellitus during pregnancy; Supervision of other high risk pregnancy, antepartum; and Anemia during pregnancy in second trimester on their problem list.  Patient reports {sx:[id number]}.  Contractions: Not present. Vag. Bleeding: None.  Movement: Present. Denies leaking of fluid.   The following portions of the patient's history were reviewed and updated as appropriate: allergies, current medications, past family history, past medical history, past social history, past surgical history and problem list.   Objective:   Vitals:   07/22/22 1138  BP: 118/69  Pulse: 88  Weight: 212 lb 12.8 oz (96.5 kg)    Fetal Status: Fetal Heart Rate (bpm): 147   Movement: Present     General:  Alert, oriented and cooperative. Patient is in no acute distress.  Skin: Skin is warm and dry. No rash noted.   Cardiovascular: Normal heart rate noted  Respiratory: Normal respiratory effort, no problems with respiration noted  Abdomen: Soft, gravid, appropriate for gestational age.  Pain/Pressure: Absent     Pelvic: {Blank single:19197::"Cervical exam performed in the presence of a chaperone","Cervical exam deferred"}        Extremities: Normal range of motion.  Edema: None  Mental Status: Normal mood and affect. Normal behavior. Normal judgment and thought content.   Assessment and Plan:  Pregnancy: G2P1001 at [redacted]w[redacted]d There are no diagnoses linked to this encounter. {Blank single:19197::"Term","Preterm"} labor symptoms and general obstetric precautions including but not limited to vaginal bleeding, contractions, leaking of fluid and fetal movement were reviewed in detail with the  patient. Please refer to After Visit Summary for other counseling recommendations.   No follow-ups on file.  Future Appointments  Date Time Provider Department Center  07/26/2022 12:00 AM MC-LD SCHED ROOM MC-INDC None  08/19/2022  9:15 AM [facility]  08/28/2022  8:40 AM Altamese [place], MD [facility] None  09/29/2022 10:45 AM Early, Sung Amabile, NP PFM-PFM PFSM    Dorathy Kinsman, CNM

## 2022-07-24 ENCOUNTER — Inpatient Hospital Stay (HOSPITAL_COMMUNITY): Payer: BC Managed Care – PPO

## 2022-07-26 ENCOUNTER — Encounter (HOSPITAL_COMMUNITY): Payer: BC Managed Care – PPO

## 2022-07-26 ENCOUNTER — Inpatient Hospital Stay (HOSPITAL_COMMUNITY): Payer: BC Managed Care – PPO | Admitting: Anesthesiology

## 2022-07-26 ENCOUNTER — Inpatient Hospital Stay (HOSPITAL_COMMUNITY)
Admission: RE | Admit: 2022-07-26 | Discharge: 2022-07-28 | DRG: 807 | Disposition: A | Discharge status: Home / Self Care | Payer: BC Managed Care – PPO | Attending: Obstetrics and Gynecology | Admitting: Obstetrics and Gynecology

## 2022-07-26 ENCOUNTER — Encounter (HOSPITAL_COMMUNITY): Payer: Self-pay

## 2022-07-26 ENCOUNTER — Inpatient Hospital Stay (HOSPITAL_COMMUNITY): Admission: RE | Admit: 2022-07-26 | Payer: BC Managed Care – PPO | Source: Ambulatory Visit

## 2022-07-26 DIAGNOSIS — Z3A38 38 weeks gestation of pregnancy: Secondary | ICD-10-CM

## 2022-07-26 DIAGNOSIS — Z5941 Food insecurity: Secondary | ICD-10-CM | POA: Diagnosis not present

## 2022-07-26 DIAGNOSIS — O9902 Anemia complicating childbirth: Secondary | ICD-10-CM | POA: Diagnosis present

## 2022-07-26 DIAGNOSIS — Z7984 Long term (current) use of oral hypoglycemic drugs: Secondary | ICD-10-CM | POA: Diagnosis not present

## 2022-07-26 DIAGNOSIS — O2412 Pre-existing diabetes mellitus, type 2, in childbirth: Principal | ICD-10-CM | POA: Diagnosis present

## 2022-07-26 DIAGNOSIS — O24119 Pre-existing diabetes mellitus, type 2, in pregnancy, unspecified trimester: Secondary | ICD-10-CM | POA: Diagnosis present

## 2022-07-26 DIAGNOSIS — O99214 Obesity complicating childbirth: Secondary | ICD-10-CM | POA: Diagnosis present

## 2022-07-26 DIAGNOSIS — O24424 Gestational diabetes mellitus in childbirth, insulin controlled: Secondary | ICD-10-CM | POA: Diagnosis not present

## 2022-07-26 DIAGNOSIS — O09899 Supervision of other high risk pregnancies, unspecified trimester: Secondary | ICD-10-CM

## 2022-07-26 DIAGNOSIS — Z794 Long term (current) use of insulin: Secondary | ICD-10-CM

## 2022-07-26 DIAGNOSIS — Z98891 History of uterine scar from previous surgery: Secondary | ICD-10-CM

## 2022-07-26 DIAGNOSIS — Z87891 Personal history of nicotine dependence: Secondary | ICD-10-CM | POA: Diagnosis not present

## 2022-07-26 DIAGNOSIS — E11649 Type 2 diabetes mellitus with hypoglycemia without coma: Secondary | ICD-10-CM | POA: Diagnosis present

## 2022-07-26 DIAGNOSIS — O34211 Maternal care for low transverse scar from previous cesarean delivery: Secondary | ICD-10-CM | POA: Diagnosis not present

## 2022-07-26 DIAGNOSIS — E119 Type 2 diabetes mellitus without complications: Principal | ICD-10-CM

## 2022-07-26 DIAGNOSIS — O34219 Maternal care for unspecified type scar from previous cesarean delivery: Secondary | ICD-10-CM | POA: Diagnosis present

## 2022-07-26 DIAGNOSIS — O99012 Anemia complicating pregnancy, second trimester: Secondary | ICD-10-CM | POA: Diagnosis present

## 2022-07-26 DIAGNOSIS — Z7982 Long term (current) use of aspirin: Secondary | ICD-10-CM

## 2022-07-26 LAB — TYPE AND SCREEN
ABO/RH(D): A POS
Antibody Screen: NEGATIVE

## 2022-07-26 LAB — GLUCOSE, CAPILLARY
Glucose-Capillary: 124 mg/dL — ABNORMAL HIGH (ref 70–99)
Glucose-Capillary: 83 mg/dL (ref 70–99)
Glucose-Capillary: 64 mg/dL — ABNORMAL LOW (ref 70–99)
Glucose-Capillary: 59 mg/dL — ABNORMAL LOW (ref 70–99)
Glucose-Capillary: 55 mg/dL — ABNORMAL LOW (ref 70–99)
Glucose-Capillary: 88 mg/dL (ref 70–99)
Glucose-Capillary: 93 mg/dL (ref 70–99)

## 2022-07-26 LAB — CBC
HCT: 32 % — ABNORMAL LOW (ref 36.0–46.0)
Hemoglobin: 10.6 g/dL — ABNORMAL LOW (ref 12.0–15.0)
MCH: 28 pg (ref 26.0–34.0)
MCHC: 33.1 g/dL (ref 30.0–36.0)
MCV: 84.7 fL (ref 80.0–100.0)
Platelets: 215 10*3/uL (ref 150–400)
RBC: 3.78 MIL/uL — ABNORMAL LOW (ref 3.87–5.11)
RDW: 14.9 % (ref 11.5–15.5)
WBC: 7.4 10*3/uL (ref 4.0–10.5)
nRBC: 0 % (ref 0.0–0.2)

## 2022-07-26 LAB — RPR: RPR Ser Ql: NONREACTIVE

## 2022-07-26 MED ORDER — OXYCODONE-ACETAMINOPHEN 5-325 MG PO TABS: 2.0000 | ORAL_TABLET | ORAL | Status: DC | PRN

## 2022-07-26 MED ORDER — DEXTROSE IN LACTATED RINGERS 5 % IV SOLN: INTRAVENOUS | Status: DC

## 2022-07-26 MED ORDER — OXYTOCIN-SODIUM CHLORIDE 30-0.9 UT/500ML-% IV SOLN
1.0000 m[IU]/min | INTRAVENOUS | Status: DC
  Administered 2022-07-26: 2 m[IU]/min via INTRAVENOUS
  Filled 2022-07-26: qty 500

## 2022-07-26 MED ORDER — LACTATED RINGERS IV SOLN
500.0000 mL | Freq: Once | INTRAVENOUS | Status: AC
  Administered 2022-07-26 (×2): 500 mL via INTRAVENOUS

## 2022-07-26 MED ORDER — OXYTOCIN-SODIUM CHLORIDE 30-0.9 UT/500ML-% IV SOLN
2.5000 [IU]/h | INTRAVENOUS | Status: DC
  Administered 2022-07-27: 2.5 [IU]/h via INTRAVENOUS
  Filled 2022-07-26 (×2): qty 500

## 2022-07-26 MED ORDER — LACTATED RINGERS IV SOLN
500.0000 mL | INTRAVENOUS | Status: DC | PRN
  Administered 2022-07-26: 500 mL via INTRAVENOUS

## 2022-07-26 MED ORDER — FENTANYL CITRATE (PF) 100 MCG/2ML IJ SOLN
100.0000 ug | INTRAMUSCULAR | Status: DC | PRN
  Administered 2022-07-26 (×5): 100 ug via INTRAVENOUS
  Filled 2022-07-26 (×5): qty 2

## 2022-07-26 MED ORDER — INSULIN ASPART 100 UNIT/ML IJ SOLN: 0.0000 [IU] | INTRAMUSCULAR | Status: DC

## 2022-07-26 MED ORDER — LIDOCAINE HCL (PF) 1 % IJ SOLN: 30.0000 mL | INTRAMUSCULAR | Status: DC | PRN

## 2022-07-26 MED ORDER — PHENYLEPHRINE 80 MCG/ML (10ML) SYRINGE FOR IV PUSH (FOR BLOOD PRESSURE SUPPORT): 80.0000 ug | PREFILLED_SYRINGE | INTRAVENOUS | Status: DC | PRN

## 2022-07-26 MED ORDER — LACTATED RINGERS IV SOLN
INTRAVENOUS | Status: DC
  Administered 2022-07-26: 125 mL via INTRAVENOUS

## 2022-07-26 MED ORDER — ACETAMINOPHEN 325 MG PO TABS: 650.0000 mg | ORAL_TABLET | ORAL | Status: DC | PRN

## 2022-07-26 MED ORDER — EPHEDRINE 5 MG/ML INJ: 10.0000 mg | INTRAVENOUS | Status: DC | PRN

## 2022-07-26 MED ORDER — INSULIN NPH (HUMAN) (ISOPHANE) 100 UNIT/ML ~~LOC~~ SUSP
5.0000 [IU] | Freq: Every day | SUBCUTANEOUS | Status: DC
Start: 2022-07-26 — End: 2022-07-26

## 2022-07-26 MED ORDER — LIDOCAINE HCL (PF) 1 % IJ SOLN
INTRAMUSCULAR | Status: DC | PRN
  Administered 2022-07-26: 4 mL via EPIDURAL
  Administered 2022-07-26: 5 mL via EPIDURAL

## 2022-07-26 MED ORDER — ONDANSETRON HCL 4 MG/2ML IJ SOLN: 4.0000 mg | Freq: Four times a day (QID) | INTRAMUSCULAR | Status: DC | PRN

## 2022-07-26 MED ORDER — TERBUTALINE SULFATE 1 MG/ML IJ SOLN: 0.2500 mg | Freq: Once | INTRAMUSCULAR | Status: DC | PRN

## 2022-07-26 MED ORDER — FENTANYL-BUPIVACAINE-NACL 0.5-0.125-0.9 MG/250ML-% EP SOLN
12.0000 mL/h | EPIDURAL | Status: DC | PRN
  Administered 2022-07-26: 12 mL/h via EPIDURAL
  Filled 2022-07-26: qty 250

## 2022-07-26 MED ORDER — SOD CITRATE-CITRIC ACID 500-334 MG/5ML PO SOLN: 30.0000 mL | ORAL | Status: DC | PRN

## 2022-07-26 MED ORDER — DIPHENHYDRAMINE HCL 50 MG/ML IJ SOLN: 12.5000 mg | INTRAMUSCULAR | Status: DC | PRN

## 2022-07-26 MED ORDER — INSULIN ASPART 100 UNIT/ML IJ SOLN
0.0000 [IU] | INTRAMUSCULAR | Status: DC
  Administered 2022-07-26: 1 [IU] via SUBCUTANEOUS
  Administered 2022-07-26: 2 [IU] via SUBCUTANEOUS

## 2022-07-26 MED ORDER — OXYTOCIN BOLUS FROM INFUSION
333.0000 mL | Freq: Once | INTRAVENOUS | Status: AC
  Administered 2022-07-27: 333 mL via INTRAVENOUS

## 2022-07-26 MED ORDER — INSULIN GLARGINE-YFGN 100 UNIT/ML ~~LOC~~ SOLN
5.0000 [IU] | Freq: Every day | SUBCUTANEOUS | Status: DC
  Administered 2022-07-26: 5 [IU] via SUBCUTANEOUS
  Filled 2022-07-26 (×2): qty 0.05

## 2022-07-26 MED ORDER — OXYCODONE-ACETAMINOPHEN 5-325 MG PO TABS: 1.0000 | ORAL_TABLET | ORAL | Status: DC | PRN

## 2022-07-26 NOTE — Progress Notes (Addendum)
Labor Progress Note Yolanda Martinez is a 28 y.o. G2P1001 at [redacted]w[redacted]d presented for IOL TOLAC for DM2 S: doing well. Has been on pitocin, feeling some pain from ctx  O:  BP 124/74   Pulse 71   Temp 98.1 F (36.7 C) (Oral)   Resp 18   Ht 5\' 2"  (1.575 m)   Wt 105 kg   LMP 11/02/2021   BMI 42.32 kg/m  EFM: 140bpm/moderate variability /+accels, no decels  CVE: Dilation: 1 Effacement (%): 60 Station: -3 Presentation: Vertex Exam by:: Dr. Ladon Applebaum   A&P: 27 y.o. G2P1001 [redacted]w[redacted]d IOL TOLAC for diabetes 2 #Labor: Progressing well. FB inserted with 60cc of fluid. Well tolerated. On pitocin at 10 units. Cut back to 6 units and keep there till FB out. #Pain: family support, IV fentanyl, epidural when ready  #FWB: Cat 1 #GBS negative  T2DM: on sliding scale insulin. Blood sugars all well controlled. Last value 64. PO juice given. CTM.  Yolanda Martinez [name], MD 11:39 AM

## 2022-07-26 NOTE — Progress Notes (Signed)
Labor Progress Note Yolanda Martinez is a 27 y.o. G2P1001 at [redacted]w[redacted]d presented for IOL TOLAC for DM2 S: doing well. Got some rest  O:  BP 120/64   Pulse 67   Temp 98.1 F (36.7 C) (Axillary)   Resp 18   Ht 5\' 2"  (1.575 m)   Wt 105 kg   LMP 11/02/2021   BMI 42.32 kg/m   EFM: 140bpm, moderate variability, +accels, intermittent variable decels  CVE: Dilation: 4.5 Effacement (%): 70 Station: -2 Presentation: Vertex Exam by:: Jinx Gilden   A&P: 27 y.o. G2P1001 [redacted]w[redacted]d IOL TOLAC for diabetes 2 #Labor: s/p FB. AROM performed with copious clear fluid.  #Pain: family support, IV fentanyl, epidural when ready  #FWB: Cat 2, due to intermittent variable decels, but overall reassuring given spontaneous accels and moderate variability #GBS negative  T2DM: on sliding scale insulin. Got 5 units of insulin glargine this morning and has had a couple recurrent hypoglycemic episodes.  Insulin glargine dc'd Continue IVF LR + D5W > plan to switch back to LR when sugars staying >100 Continue to monitor blood sugars,  SSI   Serenah Mill MD MPH OB Fellow, Faculty Practice [facility], [facility] 07/26/2022

## 2022-07-26 NOTE — H&P (Addendum)
OBSTETRIC ADMISSION HISTORY AND PHYSICAL  Yolanda Martinez is a 27 y.o. female G2P1001 with IUP at [redacted]w[redacted]d by LMP c/w 6wk U/S presenting for IOL for DM2 with worsening glucose control. She reports +FMs, No LOF, no VB, no blurry vision, headaches or peripheral edema, and RUQ pain.  She plans on breast and formula feeding. She request Nexplanon for birth control. She received her prenatal care at [facility]  Dating: By LMP c/w 6wk U/S --->  Estimated Date of Delivery: 08/09/22 Sono:  @[redacted]w[redacted]d , CWD, normal anatomy, cephalic presentation, anterior placenta, 2671g, 48%ile, EFW 5lb 14oz  Prenatal History/Complications: DM2, anemia, pCS desiring TOLAC  Nursing Staff Provider  Office Location [facility] Dating  08/09/2022, by Last Menstrual Period/6 wk [place]  [facility] Model Arly.[email or web address] ] Traditional [ ]  Centering [ ]  Mom-Baby Dyad Anatomy US  Normal  Language  English    Flu Vaccine  01/21/2022 Genetic/Carrier Screen  NIPS:   low risk AFP:   neg Horizon: neg 4/4  TDaP Vaccine   05/05/2022 Hgb A1C or  GTT T2DM Early - 5.9% Third trimester   COVID Vaccine Moderna-2 doses   LAB RESULTS   Rhogam  A/Positive/-- (12/05 0959)  Blood Type A/Positive/-- (12/05 0959)   Baby Feeding Plan Bottle Antibody Negative (12/05 0959)  Contraception Nexplanon Rubella 1.74 (12/05 0959)  Circumcision Yes RPR Non Reactive (12/05 0959)   Pediatrician  [facility] HBsAg Negative (12/05 0959)   Support Person Andrea(FOB) HCVAb Non Reactive (12/05 0959)   Prenatal Classes  HIV Non Reactive (12/05 0959)     BTL Consent NA GBS  neg  VBAC Consent 05/05/22 Pap Diagnosis  Date Value Ref Range Status  01/21/2022   Final   - Negative for intraepithelial lesion or malignancy (NILM)         DME Rx [X]  BP cuff [ ]  Weight Scale Waterbirth  [ ]  Class [ ]  Consent [ ]  CNM visit  PHQ9 & GAD7 [  ] new OB [  ] 28 weeks  [  ] 36 weeks Induction  [ ]  Orders Entered [ ] Foley Y/N   Past Medical History: Past Medical History:  Diagnosis  Date   Medical history non-contributory    Type 2 diabetes mellitus (HCC)    Vaginal Pap smear, abnormal    Past Surgical History: Past Surgical History:  Procedure Laterality Date   CESAREAN SECTION N/A 06/19/2017   Procedure: CESAREAN SECTION;  Surgeon: Tereso Newcomer, MD;  Location: [facility];  Service: Obstetrics;  Laterality: N/A;   NO PAST SURGERIES     WISDOM TOOTH EXTRACTION     Obstetrical History: OB History     Gravida  2   Para  1   Term  1   Preterm      AB      Living  1      SAB      IAB      Ectopic      Multiple  0   Live Births  1          Social History Social History   Socioeconomic History   Marital status: Married    Spouse name: Not on file   Number of children: Not on file   Years of education: Not on file   Highest education level: Not on file  Occupational History   Not on file  Tobacco Use   Smoking status: Former    Types: Cigars   Smokeless tobacco: Never  Tobacco comments:    quit prior to preg  Vaping Use   Vaping Use: Never used  Substance and Sexual Activity   Alcohol use: Not Currently    Comment: not since preg   Drug use: Not Currently    Types: Marijuana    Comment: Aug 2018   Sexual activity: Yes    Birth control/protection: None  Other Topics Concern   Not on file  Social History Narrative   Not on file   Social Determinants of Health   Financial Resource Strain: Not on file  Food Insecurity: Food Insecurity Present (07/26/2022)   Hunger Vital Sign    Worried About Running Out of Food in the Last Year: Sometimes true    Ran Out of Food in the Last Year: Never true  Transportation Needs: No Transportation Needs (07/26/2022)   PRAPARE - Administrator, Civil Service (Medical): No    Lack of Transportation (Non-Medical): No  Physical Activity: Not on file  Stress: Not on file  Social Connections: Not on file   Family History: Family History  Problem Relation Age of Onset    Hypertension Mother    Diabetes Mother    Hypertension Father    Diabetes Father    Hypertension Brother    Diabetes Brother    Hypertension Other    Cancer Other    Asthma Neg Hx    Heart disease Neg Hx    Allergies: No Known Allergies  Medications Prior to Admission  Medication Sig Dispense Refill Last Dose   aspirin EC 81 MG tablet Take 1 tablet (81 mg total) by mouth daily. Swallow whole. 30 tablet 11 07/25/2022   cetirizine (ZYRTEC ALLERGY) 10 MG tablet Take 1 tablet (10 mg total) by mouth daily. 30 tablet 3 07/25/2022   ferrous sulfate 324 (65 Fe) MG TBEC Take 1 tablet (325 mg total) by mouth every other day. 60 tablet 1 Past Week   insulin aspart (NOVOLOG FLEXPEN) 100 UNIT/ML FlexPen Inject 8 Units into the skin 3 (three) times daily with meals. 10u with breakfast, 10u with lunch, 8u with dinner 15 mL 11 07/25/2022   insulin glargine (LANTUS) 100 UNIT/ML Solostar Pen Inject 5 Units into the skin daily with breakfast. 15 mL 1 07/25/2022   metFORMIN (GLUCOPHAGE) 1000 MG tablet Take 1 tablet (1,000 mg total) by mouth 2 (two) times daily with a meal. 60 tablet 4 07/25/2022   Prenatal Vit-Fe Fumarate-FA (MULTIVITAMIN-PRENATAL) 27-0.8 MG TABS tablet Take 1 tablet by mouth daily at 12 noon.   07/25/2022   Blood Glucose Monitoring Suppl (ONETOUCH VERIO FLEX SYSTEM) w/Device KIT 1 Device by Does not apply route in the morning, at noon, in the evening, and at bedtime. 1 kit 0    Continuous Blood Gluc Sensor (DEXCOM G6 SENSOR) MISC Use 1 sensor every 10 days. (Patient not taking: Reported on 07/22/2022) 3 each 11    Continuous Glucose Transmitter (DEXCOM G6 TRANSMITTER) MISC 1 each by Does not apply route every 3 (three) months. (Patient not taking: Reported on 07/22/2022) 1 each 4    glucose blood (ONETOUCH VERIO) test strip TEST BLOOD SUGAR EVERY MORNING, AT NOON, EVERY EVENING AND AT BEDTIME 200 strip 1    Insulin Pen Needle (PEN NEEDLES) 32G X 5 MM MISC Use with insulin pen. 100 each 11    Lancets  (ONETOUCH DELICA PLUS LANCET30G) MISC       Review of Systems  All systems reviewed and negative except as stated in HPI  Blood pressure 124/70, pulse 84, temperature 98.1 F (36.7 C), temperature source Oral, resp. rate 16, height 5\' 2"  (1.575 m), weight 231 lb 6.4 oz (105 kg), last menstrual period 11/02/2021. General appearance: alert, cooperative, appears stated age, and no distress Lungs: clear to auscultation bilaterally Heart: regular rate and rhythm Abdomen: soft, non-tender; bowel sounds normal Pelvic: normal external female genitalia Extremities: Homans sign is negative, no sign of DVT DTR's normal Presentation: cephalic (confirmed w/ bedside U/S) Fetal monitoring: Baseline: 130 bpm, Variability: Good {> 6 bpm), Accelerations: Reactive, and Decelerations: Absent Uterine activity: None Dilation: Closed Effacement (%): Thick Station: -3, -2 Exam by:: Gay Filler, RN  Prenatal labs: ABO, Rh: --/--/PENDING (06/08 0110) Antibody: PENDING (06/08 0110) Rubella: 1.74 (12/05 0959) RPR: Non Reactive (03/18 1001)  HBsAg: Negative (12/05 0959)  HIV: Non Reactive (03/18 1001)  GBS: Negative/-- (05/14 1348)  Early A1C: 5.9 Genetic screening: normal Anatomy US normal  Prenatal Transfer Tool  Maternal Diabetes: Yes:  Diabetes Type:  Pre-pregnancy, Insulin/Medication controlled Genetic Screening: Normal Maternal Ultrasounds/Referrals: Normal Fetal Ultrasounds or other Referrals:  Fetal echo, Referred to Materal Fetal Medicine  Maternal Substance Abuse:  No Significant Maternal Medications:  None Significant Maternal Lab Results:  Group B Strep negative Number of Prenatal Visits:Less than or equal to 3 verified prenatal visits Other Comments:  None  Results for orders placed or performed during the hospital encounter of 07/26/22 (from the past 24 hour(s))  Type and screen   Collection Time: 07/26/22  1:10 AM  Result Value Ref Range   ABO/RH(D) PENDING    Antibody Screen  PENDING    Sample Expiration      07/29/2022,2359 Performed at [facility], [address], [place], [place] [address]   CBC    Component Value Date/Time   WBC 7.8 05/05/2022 1001   WBC 8.4 12/03/2021 0912   RBC 3.76 (L) 05/05/2022 1001   RBC 4.38 12/03/2021 0912   HGB 10.6 (L) 05/05/2022 1001   HCT 32.1 (L) 05/05/2022 1001   PLT 240 05/05/2022 1001   MCV 85 05/05/2022 1001   MCH 28.2 05/05/2022 1001   MCH 28.5 12/03/2021 0912   MCHC 33.0 05/05/2022 1001   MCHC 32.6 12/03/2021 0912   RDW 13.3 05/05/2022 1001   LYMPHSABS 2.5 01/21/2022 0959   MONOABS 0.6 04/06/2021 0834   EOSABS 0.0 01/21/2022 0959   BASOSABS 0.0 01/21/2022 0959   Patient Active Problem List   Diagnosis Date Noted   Anemia during pregnancy in second trimester 05/06/2022   Supervision of other high risk pregnancy, antepartum 12/22/2021   Type 2 diabetes mellitus during pregnancy 12/17/2021   History of cesarean section 06/21/2017   Assessment/Plan:  Clelia Schwan is a 27 y.o. G2P1001 at [redacted]w[redacted]d here for [facility] for DM2  #Labor: Cervix closed, thick and posterior, will start with low dose Pitocin, holding at 22mu/min and will assess for FB placement in 4hrs. Desires TOLAC, last CS for arrest of dilation at 6cm + fetal intolerance of labor. Consent signed 06/12/22.  #Pain: Planning epidural #FWB: Cat 1 #ID: GBS negative #MOF: Breast and formula #MOC: Nexplanon #Circ: N/A #DM: carb modified diet, q4 CBG. First glucose=124. On sliding scale insulin with basal dose of 5U insulin glargine daily with breakfast. Can convert to Endotool if needed.  #Anemia: CBC stable at 10.6  Bernerd Limbo, CNM  07/26/2022, 1:40 AM

## 2022-07-26 NOTE — Progress Notes (Signed)
Labor Progress Note Yolanda Martinez is a 27 y.o. G2P1001 at [redacted]w[redacted]d presented for IOL TOLAC for DM2 S: FB out, feels more relief with that. Mild cramping with contractions.  O:  BP (!) 126/90   Pulse 74   Temp 97.9 F (36.6 C) (Axillary)   Resp 18   Ht 5\' 2"  (1.575 m)   Wt 105 kg   LMP 11/02/2021   BMI 42.32 kg/m   EFM: 140 bpm, moderate variability, + accelerations, intermittent variable decelerations  CVE: Dilation:  (indeterm) Effacement (%): Thick Station: -3 Presentation: Vertex Exam by:: Ndule, MD   A&P: 27 y.o. G2P1001 [redacted]w[redacted]d IOL TOLAC for diabetes 2 #Labor: s/p FB. On pit at 6 units. Attempted cervical check but cervix seemed for off and tucked to maternal left. Didn't get a good sense of dilation and decided to hold off further given discomfort. Will continue to titrate up pitocin per protocol. Recheck cervix in 2hrs and consider AROM.  #Pain: family support, IV fentanyl, epidural when ready  #FWB: Cat 1 #GBS negative  T2DM: on sliding scale insulin. Got 5 units of insulin glargine this morning and has had a couple recurrent hypoglycemic episodes.  Insulin glargine dc'd Switch IVF LR to IVF LR + D5W. Continue to monitor blood sugars,  SSI   Dorris Vangorder [name], MD 4:11 PM

## 2022-07-26 NOTE — Anesthesia Preprocedure Evaluation (Signed)
Anesthesia Evaluation  Patient identified by MRN, date of birth, ID band Patient awake    Reviewed: Allergy & Precautions, NPO status , Patient's Chart, lab work & pertinent test results  History of Anesthesia Complications Negative for: history of anesthetic complications  Airway Mallampati: II   Neck ROM: Full    Dental   Pulmonary former smoker   Pulmonary exam normal        Cardiovascular negative cardio ROS Normal cardiovascular exam     Neuro/Psych negative neurological ROS  negative psych ROS   GI/Hepatic negative GI ROS, Neg liver ROS,,,  Endo/Other  diabetes, Type 2, Insulin Dependent, Oral Hypoglycemic Agents  Morbid obesity  Renal/GU negative Renal ROS     Musculoskeletal negative musculoskeletal ROS (+)    Abdominal  (+) + obese  Peds  Hematology  (+) Blood dyscrasia, anemia  Plt 215k    Anesthesia Other Findings   Reproductive/Obstetrics (+) Pregnancy  TOLAC                              Anesthesia Physical Anesthesia Plan  ASA: 3  Anesthesia Plan: Epidural   Post-op Pain Management:    Induction:   PONV Risk Score and Plan: 2 and Treatment may vary due to age or medical condition  Airway Management Planned: Natural Airway  Additional Equipment: None  Intra-op Plan:   Post-operative Plan:   Informed Consent: I have reviewed the patients History and Physical, chart, labs and discussed the procedure including the risks, benefits and alternatives for the proposed anesthesia with the patient or authorized representative who has indicated his/her understanding and acceptance.       Plan Discussed with: Anesthesiologist  Anesthesia Plan Comments: (Labs reviewed. Platelets acceptable, patient not taking any blood thinning medications. Per RN, FHR tracing reported to be stable enough for sitting procedure. Risks and benefits discussed with patient, including  PDPH, backache, epidural hematoma, failed epidural, blood pressure changes, allergic reaction, and nerve injury. Patient expressed understanding and wished to proceed.)       Anesthesia Quick Evaluation

## 2022-07-26 NOTE — Anesthesia Procedure Notes (Signed)
Epidural Patient location during procedure: OB Start time: 07/26/2022 10:35 PM End time: 07/26/2022 10:38 PM  Staffing Anesthesiologist: Beryle Lathe, MD Performed: anesthesiologist   Preanesthetic Checklist Completed: patient identified, IV checked, risks and benefits discussed, monitors and equipment checked, pre-op evaluation and timeout performed  Epidural Patient position: sitting Prep: DuraPrep Patient monitoring: continuous pulse ox and blood pressure Approach: midline Location: L2-L3 Injection technique: LOR saline  Needle:  Needle type: Tuohy  Needle gauge: 17 G Needle length: 9 cm Needle insertion depth: 7 cm Catheter size: 19 Gauge Catheter at skin depth: 12 cm Test dose: negative and Other (1% lidocaine)  Assessment Events: blood not aspirated and no cerebrospinal fluid  Additional Notes Patient identified. Risks including, but not limited to, bleeding, infection, nerve damage, paralysis, inadequate analgesia, blood pressure changes, nausea, vomiting, allergic reaction, postpartum back pain, itching, and headache were discussed. Patient expressed understanding and wished to proceed. Sterile prep and drape, including hand hygiene, mask, and sterile gloves were used. The patient was positioned and the spine was prepped. The skin was anesthetized with lidocaine. No paraesthesia or other complication noted. The patient did not experience any signs of intravascular injection such as tinnitus or metallic taste in mouth, nor signs of intrathecal spread such as rapid motor block. Please see nursing notes for vital signs. The patient tolerated the procedure well.   Leslye Peer, MDReason for block:procedure for pain

## 2022-07-27 ENCOUNTER — Encounter (HOSPITAL_COMMUNITY): Payer: Self-pay | Admitting: Obstetrics and Gynecology

## 2022-07-27 DIAGNOSIS — O34219 Maternal care for unspecified type scar from previous cesarean delivery: Secondary | ICD-10-CM

## 2022-07-27 DIAGNOSIS — Z3A38 38 weeks gestation of pregnancy: Secondary | ICD-10-CM

## 2022-07-27 DIAGNOSIS — O24424 Gestational diabetes mellitus in childbirth, insulin controlled: Secondary | ICD-10-CM

## 2022-07-27 DIAGNOSIS — O34211 Maternal care for low transverse scar from previous cesarean delivery: Secondary | ICD-10-CM

## 2022-07-27 LAB — GLUCOSE, CAPILLARY
Glucose-Capillary: 125 mg/dL — ABNORMAL HIGH (ref 70–99)
Glucose-Capillary: 71 mg/dL (ref 70–99)

## 2022-07-27 MED ORDER — IBUPROFEN 600 MG PO TABS
600.0000 mg | ORAL_TABLET | Freq: Four times a day (QID) | ORAL | Status: DC
  Administered 2022-07-27 – 2022-07-28 (×6): 600 mg via ORAL
  Filled 2022-07-27 (×6): qty 1

## 2022-07-27 MED ORDER — ZOLPIDEM TARTRATE 5 MG PO TABS: 5.0000 mg | ORAL_TABLET | Freq: Every evening | ORAL | Status: DC | PRN

## 2022-07-27 MED ORDER — SIMETHICONE 80 MG PO CHEW: 80.0000 mg | CHEWABLE_TABLET | ORAL | Status: DC | PRN

## 2022-07-27 MED ORDER — WITCH HAZEL-GLYCERIN EX PADS: 1.0000 | MEDICATED_PAD | CUTANEOUS | Status: DC | PRN

## 2022-07-27 MED ORDER — ACETAMINOPHEN 325 MG PO TABS: 650.0000 mg | ORAL_TABLET | ORAL | Status: DC | PRN

## 2022-07-27 MED ORDER — PRENATAL MULTIVITAMIN CH
1.0000 | ORAL_TABLET | Freq: Every day | ORAL | Status: DC
  Administered 2022-07-27 – 2022-07-28 (×2): 1 via ORAL
  Filled 2022-07-27 (×2): qty 1

## 2022-07-27 MED ORDER — SENNOSIDES-DOCUSATE SODIUM 8.6-50 MG PO TABS
2.0000 | ORAL_TABLET | ORAL | Status: DC
  Administered 2022-07-27 – 2022-07-28 (×2): 2 via ORAL
  Filled 2022-07-27 (×2): qty 2

## 2022-07-27 MED ORDER — COCONUT OIL OIL: 1.0000 | TOPICAL_OIL | Status: DC | PRN

## 2022-07-27 MED ORDER — BENZOCAINE-MENTHOL 20-0.5 % EX AERO
1.0000 | INHALATION_SPRAY | CUTANEOUS | Status: DC | PRN
  Administered 2022-07-27: 1 via TOPICAL
  Filled 2022-07-27: qty 56

## 2022-07-27 MED ORDER — ONDANSETRON HCL 4 MG/2ML IJ SOLN: 4.0000 mg | INTRAMUSCULAR | Status: DC | PRN

## 2022-07-27 MED ORDER — DIPHENHYDRAMINE HCL 25 MG PO CAPS: 25.0000 mg | ORAL_CAPSULE | Freq: Four times a day (QID) | ORAL | Status: DC | PRN

## 2022-07-27 MED ORDER — ONDANSETRON HCL 4 MG PO TABS: 4.0000 mg | ORAL_TABLET | ORAL | Status: DC | PRN

## 2022-07-27 MED ORDER — METFORMIN HCL 500 MG PO TABS
1000.0000 mg | ORAL_TABLET | Freq: Two times a day (BID) | ORAL | Status: DC
  Administered 2022-07-27 – 2022-07-28 (×3): 1000 mg via ORAL
  Filled 2022-07-27 (×3): qty 2

## 2022-07-27 MED ORDER — DIBUCAINE (PERIANAL) 1 % EX OINT: 1.0000 | TOPICAL_OINTMENT | CUTANEOUS | Status: DC | PRN

## 2022-07-27 MED ORDER — TETANUS-DIPHTH-ACELL PERTUSSIS 5-2.5-18.5 LF-MCG/0.5 IM SUSY: 0.5000 mL | PREFILLED_SYRINGE | Freq: Once | INTRAMUSCULAR | Status: DC

## 2022-07-27 NOTE — Lactation Note (Signed)
This note was copied from Yolanda baby's chart. Lactation Consultation Note  Patient Name: Yolanda Martinez [id number] Date: 07/27/2022 Age:27 hours Reason for consult: Initial assessment;Early term 37-38.6wks;Maternal endocrine disorder  Initial visit to 10 hours old infant. Birthing parent requests assistance with feeding. Parent is using nipple piercings. Parent reports challenges with latch with her first child, so she exclusively formula-fed. Demonstrated hand expression, collected ~4-mL of colostrum and spoonfed. Provided hand pump for colostrum expression. Demonstrated alignment, positioning, use of pillows and deep latch with cross-cradle and football position. Infant sucks her tongue however managed to get Yolanda deep, comfortable latch after several attempts. Infant actively breastfed for ~10 minutes. Discussed normal newborn behavior and patterns, signs of good milk transfer, hunger cues, tummy size and benefits of skin to skin.   Plan: 1-Feeding on demand or 8-12 times in 24h period. 2-Use manual pump as needed 3-Encouraged birthing parent rest, hydration and food intake.   Contact LC as needed for feeds/support/concerns/questions. All questions answered at this time. Provided Lactation services brochure and promoted local resources for support.  Maternal Data Has patient been taught Hand Expression?: Yes Does the patient have breastfeeding experience prior to this delivery?: No (LP has Yolanda 1-year old but did not breastfeed)  Feeding Mother's Current Feeding Choice: Breast Milk and Formula  LATCH Score Latch: Repeated attempts needed to sustain latch, nipple held in mouth throughout feeding, stimulation needed to elicit sucking reflex.  Audible Swallowing: Yolanda few with stimulation  Type of Nipple: Everted at rest and after stimulation (short shafted)  Comfort (Breast/Nipple): Soft / non-tender  Hold (Positioning): Assistance needed to correctly position infant at breast and maintain  latch.  LATCH Score: 7   Lactation Tools Discussed/Used Tools: Pump;Flanges Flange Size: 21 Breast pump type: Double-Electric Breast Pump;Manual Pump Education: Milk Storage;Setup, frequency, and cleaning Reason for Pumping: stimulation and supplementation Pumping frequency: as needed  Interventions Interventions: Breast feeding basics reviewed;Assisted with latch;Skin to skin;Breast massage;Hand express;Hand pump;Expressed milk;Support pillows;Adjust position;Position options;Education;LC Services brochure  Discharge Pump: Manual WIC Program: Yes  Consult Status Consult Status: Follow-up Date: 07/28/22 Follow-up type: In-patient    Yolanda Martinez Yolanda Martinez Yolanda Martinez 07/27/2022, 11:57 AM

## 2022-07-27 NOTE — Discharge Summary (Signed)
Postpartum Discharge Summary  Date of Service updated***     Patient Name: Yolanda Martinez DOB: 04/11/1995 MRN: [id number]  Date of admission: 07/26/2022 Delivery date:07/27/2022  Delivering provider: Celedonio Savage  Date of discharge: 07/27/2022  Admitting diagnosis: DM type 2 (diabetes mellitus, type 2) (HCC) [E11.9] Intrauterine pregnancy: [redacted]w[redacted]d     Secondary diagnosis:  Active Problems:   History of cesarean section   Type 2 diabetes mellitus during pregnancy   Supervision of other high risk pregnancy, antepartum   Anemia during pregnancy in second trimester   Vacuum-assisted vaginal delivery   VBAC, delivered  Additional problems: ***    Discharge diagnosis: Term Pregnancy Delivered, VBAC, and Type 2 DM                                              Post partum procedures:{Postpartum procedures:23558} Augmentation: AROM, Pitocin, and IP Foley Complications: None  Hospital course: Induction of Labor With Vaginal Delivery   27 y.o. yo G2P1001 at [redacted]w[redacted]d was admitted to the hospital 07/26/2022 for induction of labor.  Indication for induction: TYPE 2 DM.  Patient had an labor course complicated by rapid progression to complete.  Fetal decelerations resulting in vacuum-assisted vaginal delivery. Membrane Rupture Time/Date: 6:49 PM ,07/26/2022   Delivery Method:Vaginal, Vacuum (Extractor)  Episiotomy: None  Lacerations:  Periurethral;2nd degree;Perineal;Labial  Details of delivery can be found in separate delivery note.  Patient had a postpartum course complicated by***. Patient is discharged home 07/27/22.  Newborn Data: Birth date:07/27/2022  Birth time:12:31 AM  Gender:Female  Living status:Living  Apgars:5 ,8  TDDUKG:[id number] g   Magnesium Sulfate received: No BMZ received: No Rhophylac:N/A MMR:N/A T-DaP:Given prenatally Flu: N/A Transfusion:{Transfusion received:30440034}  Physical exam  Vitals:   07/27/22 0115 07/27/22 0130 07/27/22 0200 07/27/22 0249  BP:  124/70 (!) 114/45 (!) 127/111 (!) 113/53  Pulse: (!) 127 90  68  Resp:      Temp:      TempSrc:      SpO2:      Weight:      Height:       General: {Exam; general:21111117} Lochia: {Desc; appropriate/inappropriate:30686::"appropriate"} Uterine Fundus: {Desc; firm/soft:30687} Incision: {Exam; incision:21111123} DVT Evaluation: {Exam; dvt:2111122} Labs: Lab Results  Component Value Date   WBC 7.4 07/26/2022   HGB 10.6 (L) 07/26/2022   HCT 32.0 (L) 07/26/2022   MCV 84.7 07/26/2022   PLT 215 07/26/2022      Latest Ref Rng & Units 01/21/2022    9:57 AM  CMP  Glucose 70 - 99 mg/dL 83   BUN 6 - 20 mg/dL 10   Creatinine 2.37 - 1.00 mg/dL 6.28   Sodium 315 - 176 mmol/L 136   Potassium 3.5 - 5.2 mmol/L 4.2   Chloride 96 - 106 mmol/L 105   CO2 20 - 29 mmol/L 17   Calcium 8.7 - 10.2 mg/dL 9.6   Total Protein 6.0 - 8.5 g/dL 6.8   Total Bilirubin 0.0 - 1.2 mg/dL <1.6   Alkaline Phos 44 - 121 IU/L 56   AST 0 - 40 IU/L 17   ALT 0 - 32 IU/L 16    Edinburgh Score:    06/19/2017   11:33 PM  Edinburgh Postnatal Depression Scale Screening Tool  I have been able to laugh and see the funny side of things. 0  I have looked forward with  enjoyment to things. 0  I have blamed myself unnecessarily when things went wrong. 0  I have been anxious or worried for no good reason. 1  I have felt scared or panicky for no good reason. 1  Things have been getting on top of me. 1  I have been so unhappy that I have had difficulty sleeping. 0  I have felt sad or miserable. 0  I have been so unhappy that I have been crying. 0  The thought of harming myself has occurred to me. 0  Edinburgh Postnatal Depression Scale Total 3     After visit meds:  Allergies as of 07/27/2022   No Known Allergies   Med Rec must be completed prior to using this [facility]***        Discharge home in stable condition Infant Feeding: {Baby feeding:23562} Infant Disposition:{CHL IP OB HOME WITH  ZOXWRU:04540} Discharge instruction: per After Visit Summary and Postpartum booklet. Activity: Advance as tolerated. Pelvic rest for 6 weeks.  Diet: {OB JWJX:91478295} Future Appointments: Future Appointments  Date Time Provider Department Center  08/19/2022  9:15 AM [facility]  08/28/2022  8:40 AM Altamese [place], MD [facility] None  09/29/2022 10:45 AM Early, Sung Amabile, NP [facility]   Follow up Visit:  Message sent   Please schedule this patient for a In person postpartum visit in 6 weeks with the following provider: Any provider. Additional Postpartum F/U:2 hour GTT  High risk pregnancy complicated by:  T2DM Delivery mode:  Vaginal, Vacuum Investment banker, operational)  Anticipated Birth Control:  Nexplanon   07/27/2022 Celedonio Savage, MD

## 2022-07-27 NOTE — Anesthesia Postprocedure Evaluation (Signed)
Anesthesia Post Note  Patient: Visual merchandiser) Performed: AN AD HOC LABOR EPIDURAL     Patient location during evaluation: Mother Baby Anesthesia Type: Epidural Level of consciousness: awake and alert and oriented Pain management: satisfactory to patient Vital Signs Assessment: post-procedure vital signs reviewed and stable Respiratory status: respiratory function stable Cardiovascular status: stable Postop Assessment: no headache, no backache, epidural receding, patient able to bend at knees, no signs of nausea or vomiting, adequate PO intake and able to ambulate Anesthetic complications: no   No notable events documented.  Last Vitals:  Vitals:   07/27/22 0329 07/27/22 0420  BP: 121/63 115/69  Pulse: 71 70  Resp: 18 18  Temp: 36.8 C 37.4 C  SpO2: 100% 100%    Last Pain:  Vitals:   07/27/22 0757  TempSrc:   PainSc: 3    Pain Goal:                Epidural/Spinal Function Cutaneous sensation: Normal sensation (07/27/22 0757)  Karleen Dolphin

## 2022-07-28 ENCOUNTER — Ambulatory Visit: Payer: BC Managed Care – PPO | Admitting: "Endocrinology

## 2022-07-28 ENCOUNTER — Other Ambulatory Visit (HOSPITAL_COMMUNITY): Payer: Self-pay

## 2022-07-28 ENCOUNTER — Encounter: Payer: Self-pay | Admitting: *Deleted

## 2022-07-28 LAB — GLUCOSE, CAPILLARY: Glucose-Capillary: 70 mg/dL (ref 70–99)

## 2022-07-28 MED ORDER — SENNOSIDES-DOCUSATE SODIUM 8.6-50 MG PO TABS
2.0000 | ORAL_TABLET | ORAL | 0 refills | Status: DC
  Filled 2022-07-28: qty 30, 15d supply, fill #0

## 2022-07-28 MED ORDER — ACETAMINOPHEN 325 MG PO TABS
650.0000 mg | ORAL_TABLET | ORAL | 0 refills | Status: DC | PRN
  Filled 2022-07-28: qty 100, 9d supply, fill #0

## 2022-07-28 MED ORDER — IBUPROFEN 600 MG PO TABS
600.0000 mg | ORAL_TABLET | Freq: Four times a day (QID) | ORAL | 0 refills | Status: DC
  Filled 2022-07-28: qty 30, 8d supply, fill #0

## 2022-07-28 MED ORDER — BENZOCAINE-MENTHOL 20-0.5 % EX AERO
1.0000 | INHALATION_SPRAY | CUTANEOUS | 0 refills | Status: DC | PRN
  Filled 2022-07-28: qty 78, fill #0

## 2022-07-28 NOTE — Lactation Note (Signed)
This note was copied from a baby's chart. Lactation Consultation Note  Patient Name: Yolanda Martinez [id number] Date: 07/28/2022 Age:27 hours Reason for consult: Follow-up assessment;1st time breastfeeding;Maternal endocrine disorder  P2, First time breastfeeding.  Discussed bringing baby on deeper on breast and offering breast before formula to help establish her milk supply.  Reviewed engorgement care and monitoring voids/stools.  Maternal Data Has patient been taught Hand Expression?: Yes Does the patient have breastfeeding experience prior to this delivery?: No  Feeding Mother's Current Feeding Choice: Breast Milk and Formula  Lactation Tools Discussed/Used  DEBP  Interventions Interventions: Education;DEBP  Discharge Discharge Education: Engorgement and breast care;Warning signs for feeding baby Pump: Personal  Consult Status Consult Status: Complete Date: 07/28/22    Dahlia Byes [facility] 07/28/2022, 3:14 PM

## 2022-07-29 ENCOUNTER — Encounter (HOSPITAL_COMMUNITY): Payer: BC Managed Care – PPO

## 2022-07-30 ENCOUNTER — Other Ambulatory Visit (HOSPITAL_COMMUNITY): Payer: Self-pay

## 2022-07-31 ENCOUNTER — Encounter: Payer: Self-pay | Admitting: *Deleted

## 2022-08-16 ENCOUNTER — Telehealth (HOSPITAL_COMMUNITY): Payer: Self-pay

## 2022-08-16 NOTE — Telephone Encounter (Signed)
08/16/2022 1241  Name: Yolanda Martinez MRN: [id number] DOB: 1995/04/07  Reason for Call:  Transition of Care Hospital Discharge Call  Contact Status: Patient Contact Status: Complete  Language assistant needed: Interpreter Mode: Interpreter Not Needed        Follow-Up Questions: Do You Have Any Concerns About Your Health As You Heal From Delivery?: No Do You Have Any Concerns About Your Infants Health?: Yes What Concerns Do You Have About Your Baby?: Patient states that baby is doing good. Patient reports that baby has some yellow eye drainage. RN reviewed how to clean eye and recommended she reach out to her pediatrician with her concern. Patient has no other concerns or questions about baby.  Edinburgh Postnatal Depression Scale:  In the Past 7 Days: I have been able to laugh and see the funny side of things.: As much as I always could I have looked forward with enjoyment to things.: As much as I ever did I have blamed myself unnecessarily when things went wrong.: Not very often I have been anxious or worried for no good reason.: No, not at all I have felt scared or panicky for no good reason.: No, not at all Things have been getting on top of me.: No, most of the time I have coped quite well I have been so unhappy that I have had difficulty sleeping.: Not very often I have felt sad or miserable.: Not very often I have been so unhappy that I have been crying.: Only occasionally The thought of harming myself has occurred to me.: Never Inocente Salles Postnatal Depression Scale Total: 5  PHQ2-9 Depression Scale:     Discharge Follow-up: Edinburgh score requires follow up?: No Patient was advised of the following resources:: Breastfeeding Support Group, Support Group Did patient express any COVID concerns?: No  Post-discharge interventions: Reviewed Newborn Safe Sleep Practices  Signature  Signe Colt

## 2022-08-19 ENCOUNTER — Other Ambulatory Visit: Payer: BC Managed Care – PPO

## 2022-08-20 ENCOUNTER — Telehealth: Payer: Self-pay

## 2022-08-20 DIAGNOSIS — O24113 Pre-existing diabetes mellitus, type 2, in pregnancy, third trimester: Secondary | ICD-10-CM

## 2022-08-20 NOTE — Telephone Encounter (Signed)
Kaion Tisdale Ann Gregg Holster, CMA  ?

## 2022-08-28 ENCOUNTER — Ambulatory Visit (INDEPENDENT_AMBULATORY_CARE_PROVIDER_SITE_OTHER): Payer: BC Managed Care – PPO | Admitting: "Endocrinology

## 2022-08-28 ENCOUNTER — Encounter: Payer: Self-pay | Admitting: "Endocrinology

## 2022-08-28 VITALS — BP 100/75 | HR 84 | Ht 62.0 in | Wt 196.0 lb

## 2022-08-28 DIAGNOSIS — O2413 Pre-existing diabetes mellitus, type 2, in the puerperium: Secondary | ICD-10-CM | POA: Diagnosis not present

## 2022-08-28 DIAGNOSIS — E119 Type 2 diabetes mellitus without complications: Secondary | ICD-10-CM

## 2022-08-28 DIAGNOSIS — O24113 Pre-existing diabetes mellitus, type 2, in pregnancy, third trimester: Secondary | ICD-10-CM

## 2022-08-28 DIAGNOSIS — E7800 Pure hypercholesterolemia, unspecified: Secondary | ICD-10-CM

## 2022-08-28 LAB — MICROALBUMIN / CREATININE URINE RATIO
Creatinine,U: 227.1 mg/dL
Microalb Creat Ratio: 0.3 mg/g (ref 0.0–30.0)
Microalb, Ur: <0.7 mg/dL (ref 0.0–1.9)

## 2022-08-28 LAB — POCT GLYCOSYLATED HEMOGLOBIN (HGB A1C): Hemoglobin A1C: 5.4 % (ref 4.0–5.6)

## 2022-08-28 LAB — LIPID PANEL
Cholesterol: 251 mg/dL — ABNORMAL HIGH (ref 0–200)
HDL: 45.8 mg/dL (ref >39.00)
LDL Cholesterol: 177 mg/dL — ABNORMAL HIGH (ref 0–99)
NonHDL: 204.85
Total CHOL/HDL Ratio: 5
Triglycerides: 140 mg/dL (ref 0.0–149.0)
VLDL: 28 mg/dL (ref 0.0–40.0)

## 2022-08-28 LAB — COMPREHENSIVE METABOLIC PANEL
ALT: 13 U/L (ref 0–35)
AST: 15 U/L (ref 0–37)
Albumin: 3.5 g/dL (ref 3.5–5.2)
Alkaline Phosphatase: 75 U/L (ref 39–117)
BUN: 8 mg/dL (ref 6–23)
CO2: 26 mEq/L (ref 19–32)
Calcium: 9.2 mg/dL (ref 8.4–10.5)
Chloride: 105 mEq/L (ref 96–112)
Creatinine, Ser: 0.65 mg/dL (ref 0.40–1.20)
GFR: 121.11 mL/min (ref >60.00)
Glucose, Bld: 89 mg/dL (ref 70–99)
Potassium: 3.9 mEq/L (ref 3.5–5.1)
Sodium: 138 mEq/L (ref 135–145)
Total Bilirubin: 0.5 mg/dL (ref 0.2–1.2)
Total Protein: 6.4 g/dL (ref 6.0–8.3)

## 2022-08-28 NOTE — Patient Instructions (Signed)

## 2022-08-28 NOTE — Progress Notes (Signed)
Outpatient Endocrinology Note Yolanda Habersham, MD  08/28/22   Yolanda Martinez [facility] [birth year] [id number]  Referring Provider: Grayce Sessions, NP Primary Care Provider: Patient, No Pcp Per Reason for consultation: Subjective   Assessment & Plan  Yolanda Martinez was seen today for type 2 diabetes.  Diagnoses and all orders for this visit:  Controlled type 2 diabetes mellitus without complication, without long-term current use of insulin (HCC) -     POCT glycosylated hemoglobin (Hb A1C)  Pure hypercholesterolemia    Diabetes Type II with no known complication, No results found for: "GFR" Hba1c goal less than 7, current Hba1c is  Lab Results  Component Value Date   HGBA1C 5.4 08/28/2022   Will recommend the following: Metformin XR 500 mg 2 pill bid  Maintain annual eye and foot exam  Past A1c was as high as 9.2 Was on insulin during pregnancy: lantus, novolog  Not on it anymore   No known contraindications to any of above medications  -Last LD and Tg are as follows: Lab Results  Component Value Date   LDLCALC 155 (H) 09/09/2019    Lab Results  Component Value Date   TRIG 80 09/09/2019   -not on statin -repeat labs -Follow low fat diet and exercise   -Blood pressure goal <140/90 - Microalbumin/creatinine goal < 30 -Last MA/Cr is as follows: No results found for: "MICROALBUR", "MALB24HUR" -not on ACE/ARB -diet changes including salt restriction -limit eating outside -counseled BP targets per standards of diabetes care -uncontrolled blood pressure can lead to retinopathy, nephropathy and cardiovascular and atherosclerotic heart disease  Reviewed and counseled on: -A1C target -Blood sugar targets -Complications of uncontrolled diabetes  -Checking blood sugar before meals and bedtime and bring log next visit -All medications with mechanism of action and side effects -Hypoglycemia management: rule of 15's, Glucagon Emergency Kit and medical alert  ID -low-carb low-fat plate-method diet -At least 20 minutes of physical activity per day -Annual dilated retinal eye exam and foot exam -compliance and follow up needs -follow up as scheduled or earlier if problem gets worse  Call if blood sugar is less than 70 or consistently above 250    Take a 15 gm snack of carbohydrate at bedtime before you go to sleep if your blood sugar is less than 100.    If you are going to fast after midnight for a test or procedure, ask your physician for instructions on how to reduce/decrease your insulin dose.    Call if blood sugar is less than 70 or consistently above 250  -Treating a low sugar by rule of 15  (15 gms of sugar every 15 min until sugar is more than 70) If you feel your sugar is low, test your sugar to be sure If your sugar is low (less than 70), then take 15 grams of a fast acting Carbohydrate (3-4 glucose tablets or glucose gel or 4 ounces of juice or regular soda) Recheck your sugar 15 min after treating low to make sure it is more than 70 If sugar is still less than 70, treat again with 15 grams of carbohydrate          Don't drive the hour of hypoglycemia  If unconscious/unable to eat or drink by mouth, use glucagon injection or nasal spray baqsimi and call 911. Can repeat again in 15 min if still unconscious.  Return in about 6 months (around 02/28/2023) for visit, labs today.   I have reviewed current medications, nurse's notes, allergies,  vital signs, past medical and surgical history, family medical history, and social history for this encounter. Counseled patient on symptoms, examination findings, lab findings, imaging results, treatment decisions and monitoring and prognosis. The patient understood the recommendations and agrees with the treatment plan. All questions regarding treatment plan were fully answered.  Yolanda , MD  08/28/22   History of Present Illness Yolanda Martinez is a 27 y.o. year old female  who presents for evaluation of Type II diabetes mellitus.  Yolanda Martinez was first diagnosed in 08/2021.   Diabetes education +  Home diabetes regimen: Metformin 1000 mg bid - causes diarrhea   COMPLICATIONS -  MI/Stroke -  retinopathy -  neuropathy -  nephropathy  SYMPTOMS REVIEWED - Polyuria - Weight loss - Blurred vision  BLOOD SUGAR DATA Doesn't check BG Has meter and strips   Physical Exam  BP 100/75   Pulse 84   Ht 5\' 2"  (1.575 m)   Wt 196 lb (88.9 kg)   LMP 11/02/2021   SpO2 99%   BMI 35.85 kg/m    Constitutional: well developed, well nourished Head: normocephalic, atraumatic Eyes: sclera anicteric, no redness Neck: supple Lungs: normal respiratory effort Neurology: alert and oriented Skin: dry, no appreciable rashes Musculoskeletal: no appreciable defects Psychiatric: normal mood and affect Diabetic Foot Exam - Simple   Simple Foot Form Diabetic Foot exam was performed with the following findings: Yes 08/28/2022  9:16 AM  Visual Inspection No deformities, no ulcerations, no other skin breakdown bilaterally: Yes Sensation Testing Intact to touch and monofilament testing bilaterally: Yes Pulse Check Posterior Tibialis and Dorsalis pulse intact bilaterally: Yes Comments      Current Medications Patient's Medications  New Prescriptions   No medications on file  Previous Medications   ACETAMINOPHEN (TYLENOL) 325 MG TABLET    Take 2 tablets (650 mg total) by mouth every 4 (four) hours as needed (for pain scale < 4).   BENZOCAINE-MENTHOL (DERMOPLAST) 20-0.5 % AERO    Apply 1 Application topically as needed for irritation (perineal discomfort).   CETIRIZINE (ZYRTEC ALLERGY) 10 MG TABLET    Take 1 tablet (10 mg total) by mouth daily.   IBUPROFEN (ADVIL) 600 MG TABLET    Take 1 tablet (600 mg total) by mouth every 6 (six) hours.   METFORMIN (GLUCOPHAGE) 1000 MG TABLET    Take 1 tablet (1,000 mg total) by mouth 2 (two) times daily with a meal.    PRENATAL VIT-FE FUMARATE-FA (PRENATAL PO)    Take 1 tablet by mouth daily.   SENNA-DOCUSATE (SENOKOT-S) 8.6-50 MG TABLET    Take 2 tablets by mouth daily.  Modified Medications   No medications on file  Discontinued Medications   No medications on file    Allergies No Known Allergies  Past Medical History Past Medical History:  Diagnosis Date   Medical history non-contributory    Type 2 diabetes mellitus (HCC)    Vaginal Pap smear, abnormal     Past Surgical History Past Surgical History:  Procedure Laterality Date   CESAREAN SECTION N/A 06/19/2017   Procedure: CESAREAN SECTION;  Surgeon: Tereso Newcomer, MD;  Location: [facility];  Service: Obstetrics;  Laterality: N/A;   NO PAST SURGERIES     WISDOM TOOTH EXTRACTION      Family History family history includes Cancer in an other family member; Diabetes in her brother, father, and mother; Hypertension in her brother, father, mother, and another family member.  Social History Social History  Socioeconomic History   Marital status: Married    Spouse name: Not on file   Number of children: Not on file   Years of education: Not on file   Highest education level: Not on file  Occupational History   Not on file  Tobacco Use   Smoking status: Former    Types: Cigars   Smokeless tobacco: Never   Tobacco comments:    quit prior to preg  Vaping Use   Vaping status: Never Used  Substance and Sexual Activity   Alcohol use: Not Currently    Comment: not since preg   Drug use: Not Currently    Types: Marijuana    Comment: Aug 2018   Sexual activity: Yes    Birth control/protection: None  Other Topics Concern   Not on file  Social History Narrative   Not on file   Social Determinants of Health   Financial Resource Strain: Low Risk  (01/15/2022)   [facility]   Overall Financial Resource Strain (CARDIA)    Difficulty of Paying Living  Expenses: Not hard at all  Food Insecurity: Food Insecurity Present (07/26/2022)   Hunger Vital Sign    Worried About Running Out of Food in the Last Year: Sometimes true    Ran Out of Food in the Last Year: Never true  Transportation Needs: No Transportation Needs (07/26/2022)   PRAPARE - Administrator, Civil Service (Medical): No    Lack of Transportation (Non-Medical): No  Physical Activity: Not on File (06/12/2021)   Received from [place], [place]   Physical Activity    Physical Activity: 0  Stress: No Stress Concern Present (01/15/2022)   [facility]   [employer] of Occupational Health - Occupational Stress Questionnaire    Feeling of Stress : Not at all  Social Connections: Not on File (06/12/2021)   Received from [place], [place]   Social Connections    Social Connections and Isolation: 0  Intimate Partner Violence: Not At Risk (07/26/2022)   Humiliation, Afraid, Rape, and Kick questionnaire    Fear of Current or Ex-Partner: No    Emotionally Abused: No    Physically Abused: No    Sexually Abused: No    Lab Results  Component Value Date   HGBA1C 5.4 08/28/2022   HGBA1C 5.9 (H) 01/21/2022   HGBA1C 9.2 (A) 09/06/2021   Lab Results  Component Value Date   CHOL 219 (H) 09/09/2019   Lab Results  Component Value Date   HDL 50 09/09/2019   Lab Results  Component Value Date   LDLCALC 155 (H) 09/09/2019   Lab Results  Component Value Date   TRIG 80 09/09/2019   Lab Results  Component Value Date   CHOLHDL 4.4 09/09/2019   Lab Results  Component Value Date   CREATININE 0.49 (L) 01/21/2022   No results found for: "GFR" No results found for: "MICROALBUR", "MALB24HUR"    Component Value Date/Time   NA 136 01/21/2022 0957   K 4.2 01/21/2022 0957   CL 105 01/21/2022 0957   CO2 17 (L) 01/21/2022 0957   GLUCOSE 83 01/21/2022 0957   GLUCOSE 124 (H) 08/31/2021 1354   BUN 10 01/21/2022  0957   CREATININE 0.49 (L) 01/21/2022 0957   CALCIUM 9.6 01/21/2022 0957   PROT 6.8 01/21/2022 0957   ALBUMIN 4.0 01/21/2022 0957   AST 17 01/21/2022  0957   ALT 16 01/21/2022 0957   ALKPHOS 56 01/21/2022 0957   BILITOT <0.2 01/21/2022 0957   GFRNONAA >60 08/31/2021 1354   GFRAA 145 09/09/2019 0915      Latest Ref Rng & Units 01/21/2022    9:57 AM 08/31/2021    1:54 PM 04/06/2021    8:34 AM  BMP  Glucose 70 - 99 mg/dL 83  960  454   BUN 6 - 20 mg/dL 10  6  9    Creatinine 0.57 - 1.00 mg/dL 0.98  1.19  1.47   BUN/Creat Ratio 9 - 23 20     Sodium 134 - 144 mmol/L 136  141  133   Potassium 3.5 - 5.2 mmol/L 4.2  3.6  5.4   Chloride 96 - 106 mmol/L 105  103  101   CO2 20 - 29 mmol/L 17  21  23    Calcium 8.7 - 10.2 mg/dL 9.6  9.9  8.9        Component Value Date/Time   WBC 7.4 07/26/2022 0120   RBC 3.78 (L) 07/26/2022 0120   HGB 10.6 (L) 07/26/2022 0120   HGB 10.6 (L) 05/05/2022 1001   HCT 32.0 (L) 07/26/2022 0120   HCT 32.1 (L) 05/05/2022 1001   PLT 215 07/26/2022 0120   PLT 240 05/05/2022 1001   MCV 84.7 07/26/2022 0120   MCV 85 05/05/2022 1001   MCH 28.0 07/26/2022 0120   MCHC 33.1 07/26/2022 0120   RDW 14.9 07/26/2022 0120   RDW 13.3 05/05/2022 1001   LYMPHSABS 2.5 01/21/2022 0959   MONOABS 0.6 04/06/2021 0834   EOSABS 0.0 01/21/2022 0959   BASOSABS 0.0 01/21/2022 0959     Parts of this note may have been dictated using voice recognition software. There may be variances in spelling and vocabulary which are unintentional. Not all errors are proofread. Please notify the Thereasa Parkin if any discrepancies are noted or if the meaning of any statement is not clear.

## 2022-08-29 ENCOUNTER — Other Ambulatory Visit: Payer: Self-pay | Admitting: "Endocrinology

## 2022-08-29 MED ORDER — METFORMIN HCL ER 500 MG PO TB24: 500.0000 mg | ORAL_TABLET | Freq: Two times a day (BID) | ORAL | 3 refills | Status: AC

## 2022-09-02 ENCOUNTER — Other Ambulatory Visit: Payer: Self-pay

## 2022-09-02 ENCOUNTER — Ambulatory Visit (INDEPENDENT_AMBULATORY_CARE_PROVIDER_SITE_OTHER): Payer: BC Managed Care – PPO | Admitting: Dietician

## 2022-09-02 ENCOUNTER — Encounter: Payer: BC Managed Care – PPO | Attending: Family Medicine | Admitting: Dietician

## 2022-09-02 VITALS — Wt 193.0 lb

## 2022-09-02 DIAGNOSIS — E119 Type 2 diabetes mellitus without complications: Secondary | ICD-10-CM

## 2022-09-02 DIAGNOSIS — Z3A11 11 weeks gestation of pregnancy: Secondary | ICD-10-CM | POA: Insufficient documentation

## 2022-09-02 DIAGNOSIS — O09899 Supervision of other high risk pregnancies, unspecified trimester: Secondary | ICD-10-CM | POA: Insufficient documentation

## 2022-09-02 DIAGNOSIS — O24111 Pre-existing diabetes mellitus, type 2, in pregnancy, first trimester: Secondary | ICD-10-CM | POA: Diagnosis not present

## 2022-09-02 DIAGNOSIS — Z7984 Long term (current) use of oral hypoglycemic drugs: Secondary | ICD-10-CM | POA: Insufficient documentation

## 2022-09-02 NOTE — Patient Instructions (Signed)
Choose mostly water or other beverages that do not have carbohydrates. Avoid skipping meals - small meals are fine. Snack if hungry - consider nutrition quality Decrease saturated fat -   Choose a butter/olive oil blend  Remove skin from chicken  Less red meat  More meatless meals  Less cheese, sour cream butter, other high fat dairy Aim for 1/2 your plate to be vegetables Aim to be active most days

## 2022-09-02 NOTE — Progress Notes (Signed)
Diabetes Self-Management Education  Visit Type: Follow-up  Appt. Start Time: 0920 Appt. End Time: 0940  09/02/2022  Yolanda Martinez, identified by name and date of birth, is a 27 y.o. female with a diagnosis of Diabetes:  .   ASSESSMENT Patient is here today alone. Start time:  0920 End time:  0950  Delivered a healthy baby 07/27/2022. She is breastfeeding.  She is returning to work in August.  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  History includes:  Type 2 Diabetes Medications include:  Metformin Labs noted to include: A1C 5.4% 08/28/2022 decreased from 5.9% 01/2022 and 9.2% 09/06/2021 Lipid Panel     Component Value Date/Time   CHOL 251 (H) 08/28/2022 0943   CHOL 219 (H) 09/09/2019 0915   TRIG 140.0 08/28/2022 0943   HDL 45.80 08/28/2022 0943   HDL 50 09/09/2019 0915   CHOLHDL 5 08/28/2022 0943   VLDL 28.0 08/28/2022 0943   LDLCALC 177 (H) 08/28/2022 0943   LDLCALC 155 (H) 09/09/2019 0915   LABVLDL 14 09/09/2019 0915   Patient has 2 jobs - one from [employer] on evenings and weekends Walking some but not a lot and is waiting for 6 weeks postpartum.  Weight 193 lb (87.5 kg), last menstrual period 11/02/2021, unknown if currently breastfeeding. Body mass index is 35.3 kg/m.   Diabetes Self-Management Education - 09/02/22 1847       Visit Information   Visit Type Follow-up      Psychosocial Assessment   Self-care barriers None    Self-management support Doctor's office    Other persons present Patient    Patient Concerns Nutrition/Meal planning;Healthy Lifestyle;Glycemic Control    Special Needs None    Preferred Learning Style No preference indicated    Learning Readiness Ready      Pre-Education Assessment   Patient understands the diabetes disease and treatment process. Comprehends key points    Patient understands incorporating nutritional management into lifestyle. Needs Review    Patient undertands  incorporating physical activity into lifestyle. Comprehends key points    Patient understands using medications safely. Comprehends key points    Patient understands monitoring blood glucose, interpreting and using results Comprehends key points    Patient understands prevention, detection, and treatment of acute complications. Comprehends key points    Patient understands prevention, detection, and treatment of chronic complications. Compreheands key points    Patient understands how to develop strategies to address psychosocial issues. Comprehends key points    Patient understands how to develop strategies to promote health/change behavior. Needs Review      Complications   Last HgB A1C per patient/outside source 5.4 %   08/28/2022 decreased from 5.9% 01/2022 and 9.2% 09/06/2021     Dietary Intake   Lunch leftovers - chicken, corn, mac and cheese    Dinner seafood boil (shrimp, crab legs, corn, potatoes)    Beverage(s) water, occasional regular soda, occasional juice      Activity / Exercise   Activity / Exercise Type Light (walking / raking leaves)      Patient Education   Previous Diabetes Education Yes (please comment)   07/27/2022   Healthy Eating Plate Method;Meal options for control of blood glucose level and chronic complications.;Other (comment)   low saturated fat, increased fiber to lower cholesterol   Being Active Role of exercise on diabetes management, blood pressure control and cardiac health.    Medications Reviewed patients medication for diabetes,  action, purpose, timing of dose and side effects.    Diabetes Stress and Support Identified and addressed patients feelings and concerns about diabetes;Worked with patient to identify barriers to care and solutions      Individualized Goals (developed by patient)   Nutrition General guidelines for healthy choices and portions discussed    Physical Activity Exercise 5-7 days per week;30 minutes per day   when cleared by MD    Problem Solving Eating Pattern;Addressing barriers to behavior change      Patient Self-Evaluation of Goals - Patient rates self as meeting previously set goals (% of time)   Nutrition 50 - 75 % (half of the time)    Physical Activity 25 - 50% (sometimes)    Medications >75% (most of the time)    Monitoring 50 - 75 % (half of the time)    Problem Solving and behavior change strategies  >75% (most of the time)    Reducing Risk (treating acute and chronic complications) >75% (most of the time)    Health Coping >75% (most of the time)      Post-Education Assessment   Patient understands the diabetes disease and treatment process. Comprehends key points    Patient understands incorporating nutritional management into lifestyle. Needs Review    Patient undertands incorporating physical activity into lifestyle. Comprehends key points    Patient understands using medications safely. Comphrehends key points    Patient understands monitoring blood glucose, interpreting and using results Comprehends key points    Patient understands prevention, detection, and treatment of acute complications. Comprehends key points    Patient understands prevention, detection, and treatment of chronic complications. Comprehends key points    Patient understands how to develop strategies to address psychosocial issues. Comprehends key points    Patient understands how to develop strategies to promote health/change behavior. Needs Review      Outcomes   Expected Outcomes Demonstrated interest in learning. Expect positive outcomes    Future DMSE 6 months    Program Status Not Completed      Subsequent Visit   Since your last visit have you continued or begun to take your medications as prescribed? Yes             Individualized Plan for Diabetes Self-Management Training:   Learning Objective:  Patient will have a greater understanding of diabetes self-management. Patient education plan is to attend  individual and/or group sessions per assessed needs and concerns.   Plan:   Patient Instructions  Choose mostly water or other beverages that do not have carbohydrates. Avoid skipping meals - small meals are fine. Snack if hungry - consider nutrition quality Decrease saturated fat -   Choose a butter/olive oil blend  Remove skin from chicken  Less red meat  More meatless meals  Less cheese, sour cream butter, other high fat dairy Aim for 1/2 your plate to be vegetables Aim to be active most days  Expected Outcomes:  Demonstrated interest in learning. Expect positive outcomes  Education material provided:   If problems or questions, patient to contact team via:  Phone  Future DSME appointment: 6 months

## 2022-09-03 ENCOUNTER — Other Ambulatory Visit: Payer: Self-pay | Admitting: "Endocrinology

## 2022-09-03 DIAGNOSIS — E119 Type 2 diabetes mellitus without complications: Secondary | ICD-10-CM

## 2022-09-05 NOTE — Addendum Note (Signed)
Addended by: Bonnita Levan on: 09/05/2022 02:14 PM   Modules accepted: Level of Service

## 2022-09-05 NOTE — Addendum Note (Signed)
Addended by: Bonnita Levan on: 09/05/2022 09:27 AM   Modules accepted: Level of Service

## 2022-09-08 ENCOUNTER — Ambulatory Visit (INDEPENDENT_AMBULATORY_CARE_PROVIDER_SITE_OTHER): Payer: BC Managed Care – PPO | Admitting: Obstetrics and Gynecology

## 2022-09-08 ENCOUNTER — Other Ambulatory Visit: Payer: Self-pay

## 2022-09-08 ENCOUNTER — Encounter: Payer: Self-pay | Admitting: Obstetrics and Gynecology

## 2022-09-08 DIAGNOSIS — Z30017 Encounter for initial prescription of implantable subdermal contraceptive: Secondary | ICD-10-CM | POA: Diagnosis not present

## 2022-09-08 LAB — POCT PREGNANCY, URINE: Preg Test, Ur: NEGATIVE

## 2022-09-08 MED ORDER — ETONOGESTREL 68 MG ~~LOC~~ IMPL
68.0000 mg | DRUG_IMPLANT | Freq: Once | SUBCUTANEOUS | Status: AC
Start: 2022-09-08 — End: 2022-09-08
  Administered 2022-09-08: 68 mg via SUBCUTANEOUS

## 2022-09-08 NOTE — Progress Notes (Signed)
Post Partum Visit Note  Yolanda Martinez is a 27 y.o. [id number] female who presents for a postpartum visit. She is  6.1  weeks postpartum following a vacuum-assisted vaginal delivery.  I have fully reviewed the prenatal and intrapartum course. The delivery was at 38.1 gestational weeks.  Anesthesia: epidural. Postpartum course has been good. Baby is doing well. Baby is feeding by both breast and bottle - Similac Sensitive RS. Bleeding  spotting off and on . Bowel function is normal. Bladder function is normal. Patient is not sexually active. Contraception method is none. Postpartum depression screening: negative.   The pregnancy intention screening data noted above was reviewed. Potential methods of contraception were discussed. The patient elected to proceed with nexplanon.   Edinburgh Postnatal Depression Scale - 09/08/22 1626       Edinburgh Postnatal Depression Scale:  In the Past 7 Days   I have been able to laugh and see the funny side of things. 0    I have looked forward with enjoyment to things. 0    I have blamed myself unnecessarily when things went wrong. 0    I have been anxious or worried for no good reason. 0    I have felt scared or panicky for no good reason. 0    Things have been getting on top of me. 2    I have been so unhappy that I have had difficulty sleeping. 0    I have felt sad or miserable. 0    I have been so unhappy that I have been crying. 1    The thought of harming myself has occurred to me. 0    Edinburgh Postnatal Depression Scale Total 3             Health Maintenance Due  Topic Date Due   OPHTHALMOLOGY EXAM  Never done   HPV VACCINES (1 - 3-dose series) Never done   COVID-19 Vaccine (3 - 2023-24 season) 10/18/2021    The following portions of the patient's history were reviewed and updated as appropriate: allergies, current medications, past family history, past medical history, past social history, past surgical history, and  problem list.  Review of Systems Pertinent items are noted in HPI.  Objective:  BP 115/80   Pulse 84   Wt 192 lb (87.1 kg)   LMP 11/02/2021   Breastfeeding Yes   BMI 35.12 kg/m    General:  alert, cooperative, no distress, and mildly obese   Breasts:  not indicated  Lungs: clear to auscultation bilaterally  Heart:  regular rate and rhythm  Abdomen: Nontender, nondistended , soft    Wound N/a  GU exam:  not indicated       Assessment:    Encounter for postpartum care  normal postpartum exam.   Plan:   Essential components of care per ACOG recommendations:  1.  Mood and well being: Patient with negative depression screening today. Reviewed local resources for support.  - Patient tobacco use? No.   - hx of drug use? No.    2. Infant care and feeding:  -Patient currently breastmilk feeding? Yes. Reviewed importance of draining breast regularly to support lactation.  -Social determinants of health (SDOH) reviewed in EPIC. No concerns.  3. Sexuality, contraception and birth spacing - Patient does not want a pregnancy in the next year.  Desired family size is 2 children.  - Reviewed reproductive life planning. Reviewed contraceptive methods based on pt preferences and effectiveness.  Patient desired  Hormonal Implant today.   - Discussed birth spacing of 18 months  4. Sleep and fatigue -Encouraged family/partner/community support of 4 hrs of uninterrupted sleep to help with mood and fatigue  5. Physical Recovery  - Discussed patients delivery and complications. She describes her labor as good. - Patient had a Vaginal, no problems at delivery. Patient had a 2nd degree laceration. Perineal healing reviewed. Patient expressed understanding - Patient has urinary incontinence? No. - Patient is safe to resume physical and sexual activity  6.  Health Maintenance - HM due items addressed Yes - Last pap smear  Diagnosis  Date Value Ref Range Status  01/21/2022   Final   -  Negative for intraepithelial lesion or malignancy (NILM)   Pap smear not done at today's visit.  -Breast Cancer screening indicated? No.   7. Chronic Disease/Pregnancy Condition follow up:  type 2 diabetes Pt follows up with her PCP and her A1c was 5.4  - PCP follow up  Warden Fillers, MD Center for [facility], [facility]

## 2022-09-08 NOTE — Progress Notes (Signed)
     GYNECOLOGY OFFICE PROCEDURE NOTE  Yolanda Martinez is a 27 y.o. [id number] here for  Nexplanon insertion.  Last pap smear was on 12/23 and was normal.  No other gynecologic concerns.  Nexplanon Insertion Procedure Patient identified, informed consent performed, consent signed.   Patient does understand that irregular bleeding is a very common side effect of this medication. She was advised to have backup contraception for one week after placement. Pregnancy test in clinic today was negative.  Appropriate time out taken.  Patient's left arm was prepped and draped in the usual sterile fashion. The ruler used to measure and mark insertion area.  Patient was prepped with alcohol swab and then injected with 3 ml of 1% lidocaine.  She was prepped with betadine, Nexplanon removed from packaging,  Device confirmed in needle, then inserted full length of needle and withdrawn per handbook instructions. Nexplanon was able to palpated in the patient's arm; patient palpated the insert herself. There was minimal blood loss.  Patient insertion site covered with guaze and a pressure bandage to reduce any bruising.  The patient tolerated the procedure well and was given post procedure instructions.    Mariel Aloe, MD, FACOG Obstetrician & Gynecologist, [facility] for [facility], [facility]

## 2022-09-28 ENCOUNTER — Encounter: Payer: Self-pay | Admitting: Nurse Practitioner

## 2022-09-28 NOTE — Progress Notes (Deleted)
Tollie Eth, DNP, AGNP-c Primary Care & Sports Medicine [address] [place], [place] [address] Main Office [phone]   New patient visit   Patient: Yolanda Martinez   DOB: 08-30-1995   27 y.o. Female  MRN: [id number] Visit Date: 09/29/2022  Patient Care Team: Patient, No Pcp Per as PCP - General (General Practice)  There were no vitals filed for this visit. There is no height or weight on file to calculate BMI.   Today's healthcare provider: Tollie Eth, NP   No chief complaint on file.  Subjective    Yolanda Martinez is a 27 y.o. female who presents today as a new patient to establish care.    Chart review shows a medical history positive for Gestational Diabetes  History reviewed and reveals the following: Past Medical History:  Diagnosis Date   Medical history non-contributory    Type 2 diabetes mellitus (HCC)    Vaginal Pap smear, abnormal    Past Surgical History:  Procedure Laterality Date   CESAREAN SECTION N/A 06/19/2017   Procedure: CESAREAN SECTION;  Surgeon: Tereso Newcomer, MD;  Location: [facility];  Service: Obstetrics;  Laterality: N/A;   NO PAST SURGERIES     WISDOM TOOTH EXTRACTION     Family Status  Relation Name Status   Mother  Alive   Father  Alive   Brother  (Not Specified)   Other  (Not Specified)   Neg Hx  (Not Specified)  No partnership data on file   Family History  Problem Relation Age of Onset   Hypertension Mother    Diabetes Mother    Hypertension Father    Diabetes Father    Hypertension Brother    Diabetes Brother    Hypertension Other    Cancer Other    Asthma Neg Hx    Heart disease Neg Hx    Social History   Socioeconomic History   Marital status: Married    Spouse name: Not on file   Number of children: Not on file   Years of education: Not on file   Highest education level: Not on file  Occupational History   Not on file  Tobacco Use   Smoking status: Former     Types: Cigars   Smokeless tobacco: Never   Tobacco comments:    quit prior to preg  Vaping Use   Vaping status: Never Used  Substance and Sexual Activity   Alcohol use: Not Currently    Comment: not since preg   Drug use: Not Currently    Types: Marijuana    Comment: Aug 2018   Sexual activity: Yes    Birth control/protection: None  Other Topics Concern   Not on file  Social History Narrative   Not on file   Social Determinants of Health   Financial Resource Strain: Low Risk  (01/15/2022)   [facility]   Overall Financial Resource Strain (CARDIA)    Difficulty of Paying Living Expenses: Not hard at all  Food Insecurity: Food Insecurity Present (07/26/2022)   Hunger Vital Sign    Worried About Running Out of Food in the Last Year: Sometimes true    Ran Out of Food in the Last Year: Never true  Transportation Needs: No Transportation Needs (07/26/2022)   PRAPARE - Administrator, Civil Service (Medical): No    Lack of Transportation (Non-Medical): No  Physical Activity: Not on  File (06/12/2021)   Received from [place], [place]   Physical Activity    Physical Activity: 0  Stress: No Stress Concern Present (01/15/2022)   [facility]   [employer] of Occupational Health - Occupational Stress Questionnaire    Feeling of Stress : Not at all  Social Connections: Not on File (06/12/2021)   Received from [place], [place]   Social Connections    Social Connections and Isolation: 0   Outpatient Medications Prior to Visit  Medication Sig   metFORMIN (GLUCOPHAGE-XR) 500 MG 24 hr tablet Take 1 tablet (500 mg total) by mouth 2 (two) times daily with a meal.   Prenatal Vit-Fe Fumarate-FA (PRENATAL PO) Take 1 tablet by mouth daily.   No facility-administered medications prior to visit.   No Known Allergies Immunization History   Administered Date(s) Administered   Influenza,inj,Quad PF,6+ Mos 01/21/2022   Moderna Sars-Covid-2 Vaccination 06/22/2019, 07/20/2019   Tdap 05/05/2022    Health Maintenance Due Health Maintenance Topics with due status: Overdue     Topic Date Due   OPHTHALMOLOGY EXAM Never done   HPV VACCINES Never done   COVID-19 Vaccine 10/18/2021   Health Maintenance Topics with due status: Due On     Topic Date Due   INFLUENZA VACCINE 09/18/2022    Review of Systems All review of systems negative except what is listed in the HPI   Objective    LMP 11/02/2021  Physical Exam  No results found for any visits on 09/29/22.  Assessment & Plan      Problem List Items Addressed This Visit   None    No follow-ups on file.      , Sung Amabile, NP, DNP, AGNP-C [facility]

## 2022-09-29 ENCOUNTER — Institutional Professional Consult (permissible substitution): Payer: BC Managed Care – PPO | Admitting: Nurse Practitioner

## 2022-10-05 ENCOUNTER — Other Ambulatory Visit: Payer: Self-pay

## 2022-10-05 ENCOUNTER — Encounter (HOSPITAL_BASED_OUTPATIENT_CLINIC_OR_DEPARTMENT_OTHER): Payer: Self-pay | Admitting: Emergency Medicine

## 2022-10-05 ENCOUNTER — Emergency Department (HOSPITAL_BASED_OUTPATIENT_CLINIC_OR_DEPARTMENT_OTHER)
Admission: EM | Admit: 2022-10-05 | Discharge: 2022-10-05 | Disposition: A | Discharge status: Home / Self Care | Payer: BC Managed Care – PPO | Attending: Emergency Medicine | Admitting: Emergency Medicine

## 2022-10-05 DIAGNOSIS — Z202 Contact with and (suspected) exposure to infections with a predominantly sexual mode of transmission: Secondary | ICD-10-CM | POA: Diagnosis not present

## 2022-10-05 DIAGNOSIS — Z7984 Long term (current) use of oral hypoglycemic drugs: Secondary | ICD-10-CM | POA: Insufficient documentation

## 2022-10-05 DIAGNOSIS — N939 Abnormal uterine and vaginal bleeding, unspecified: Secondary | ICD-10-CM | POA: Diagnosis not present

## 2022-10-05 DIAGNOSIS — Z113 Encounter for screening for infections with a predominantly sexual mode of transmission: Secondary | ICD-10-CM

## 2022-10-05 DIAGNOSIS — R109 Unspecified abdominal pain: Secondary | ICD-10-CM | POA: Diagnosis present

## 2022-10-05 DIAGNOSIS — E119 Type 2 diabetes mellitus without complications: Secondary | ICD-10-CM | POA: Diagnosis not present

## 2022-10-05 LAB — URINALYSIS, ROUTINE W REFLEX MICROSCOPIC
Bilirubin Urine: NEGATIVE
Glucose, UA: NEGATIVE mg/dL
Ketones, ur: NEGATIVE mg/dL
Nitrite: NEGATIVE
Specific Gravity, Urine: 1.033 — ABNORMAL HIGH (ref 1.005–1.030)
pH: 6.5 (ref 5.0–8.0)

## 2022-10-05 LAB — WET PREP, GENITAL
Sperm: NONE SEEN
Trich, Wet Prep: NONE SEEN
WBC, Wet Prep HPF POC: >=10 — AB (ref <10)
Yeast Wet Prep HPF POC: NONE SEEN

## 2022-10-05 LAB — PREGNANCY, URINE: Preg Test, Ur: NEGATIVE

## 2022-10-05 MED ORDER — METRONIDAZOLE 0.75 % VA GEL: 1.0000 | Freq: Every day | VAGINAL | 0 refills | Status: AC

## 2022-10-05 MED ORDER — METRONIDAZOLE 0.75 % VA GEL: 1.0000 | Freq: Every day | VAGINAL | 0 refills | Status: DC

## 2022-10-05 NOTE — ED Triage Notes (Addendum)
Pt in with light vaginal bleeding and cramping. Pt states she has been lifting heavy boxes at work, concerned for some sort of strain that occurred, also wants STI screening. States LMP 8/3, has Nexplanon placed late July

## 2022-10-05 NOTE — Discharge Instructions (Addendum)
You were seen in the emerged from today for evaluation of your vaginal irritation.  Your labs show that you have bacterial vaginosis.  For treatment for this, I will prescribe you some MetroGel to apply into your vagina at night for the next 5 days.  Please avoid intercourse for the next week.  Please check back for your gonorrhea and chlamydia.  You may be experiencing this irritation because of the condoms that were used during your last intercourse.  Please be mindful what this is as you may have an allergy.  I do not see any abnormalities to your exam.  I think the vaginal spotting you are having is typical given that you just had your Nexplanon placed.  If you start to have heavy menstrual bleeding, pain, fever, abdominal pain, or urinary symptoms, please return to your emergency department.  Otherwise, please follow-up with your primary care doctor as needed.

## 2022-10-06 ENCOUNTER — Ambulatory Visit (HOSPITAL_COMMUNITY): Payer: BC Managed Care – PPO

## 2022-10-06 NOTE — ED Provider Notes (Signed)
[place] EMERGENCY DEPARTMENT AT [facility] Provider Note   CSN: [id number] Arrival date & time: 10/05/22  1921     History {Add pertinent medical, surgical, social history, OB history to HPI:1} Chief Complaint  Patient presents with   Vaginal Bleeding    Yolanda Martinez is a 27 y.o. female.   Vaginal Bleeding      Home Medications Prior to Admission medications   Medication Sig Start Date End Date Taking? Authorizing Provider  metFORMIN (GLUCOPHAGE-XR) 500 MG 24 hr tablet Take 1 tablet (500 mg total) by mouth 2 (two) times daily with a meal. 08/29/22   Motwani, Komal, MD  metroNIDAZOLE (METROGEL) 0.75 % vaginal gel Place 1 Applicatorful vaginally at bedtime for 5 days. 10/05/22 10/10/22  Achille Rich, PA-C  Prenatal Vit-Fe Fumarate-FA (PRENATAL PO) Take 1 tablet by mouth daily.    [provider]      Allergies    Patient has no known allergies.    Review of Systems   Review of Systems  Genitourinary:  Positive for vaginal bleeding.    Physical Exam Updated Vital Signs BP 117/70 (BP Location: Right Arm)   Pulse 72   Temp 98.3 F (36.8 C) (Oral)   Resp 16   Wt 87.1 kg   LMP 11/02/2021   SpO2 99%   BMI 35.12 kg/m  Physical Exam  ED Results / Procedures / Treatments   Labs (all labs ordered are listed, but only abnormal results are displayed) Labs Reviewed  WET PREP, GENITAL - Abnormal; Notable for the following components:      Result Value   Clue Cells Wet Prep HPF POC PRESENT (*)    WBC, Wet Prep HPF POC >=10 (*)    All other components within normal limits  URINALYSIS, ROUTINE W REFLEX MICROSCOPIC - Abnormal; Notable for the following components:   Specific Gravity, Urine 1.033 (*)    Hgb urine dipstick LARGE (*)    Protein, ur TRACE (*)    Leukocytes,Ua SMALL (*)    Bacteria, UA RARE (*)    All other components within normal limits  PREGNANCY, URINE  GC/CHLAMYDIA PROBE AMP ([place]) NOT AT [facility]     EKG None  Radiology No results found.  Procedures Procedures  {Document cardiac monitor, telemetry assessment procedure when appropriate:1}  Medications Ordered in ED Medications - No data to display  ED Course/ Medical Decision Making/ A&P   {   Click here for ABCD2, HEART and other calculatorsREFRESH Note before signing :1}                              Medical Decision Making Amount and/or Complexity of Data Reviewed Labs: ordered.  Risk Prescription drug management.   ***  {Document critical care time when appropriate:1} {Document review of labs and clinical decision tools ie heart score, Chads2Vasc2 etc:1}  {Document your independent review of radiology images, and any outside records:1} {Document your discussion with family members, caretakers, and with consultants:1} {Document social determinants of health affecting pt's care:1} {Document your decision making why or why not admission, treatments were needed:1} Final Clinical Impression(s) / ED Diagnoses Final diagnoses:  Screening examination for STD (sexually transmitted disease)  Vaginal spotting    Rx / DC Orders ED Discharge Orders          Ordered    metroNIDAZOLE (METROGEL) 0.75 % vaginal gel  Daily at bedtime,   Status:  Discontinued  10/05/22 2341    metroNIDAZOLE (METROGEL) 0.75 % vaginal gel  Daily at bedtime        10/05/22 2350

## 2022-10-07 LAB — GC/CHLAMYDIA PROBE AMP (~~LOC~~) NOT AT ARMC
Chlamydia: NEGATIVE
Comment: NEGATIVE
Comment: NORMAL
Neisseria Gonorrhea: NEGATIVE

## 2022-11-04 ENCOUNTER — Encounter: Payer: Self-pay | Admitting: Nurse Practitioner

## 2022-11-04 ENCOUNTER — Ambulatory Visit (INDEPENDENT_AMBULATORY_CARE_PROVIDER_SITE_OTHER): Payer: BC Managed Care – PPO | Admitting: Nurse Practitioner

## 2022-11-04 VITALS — BP 124/78 | HR 92 | Ht 63.0 in | Wt 185.8 lb

## 2022-11-04 DIAGNOSIS — Z0000 Encounter for general adult medical examination without abnormal findings: Secondary | ICD-10-CM | POA: Diagnosis not present

## 2022-11-04 DIAGNOSIS — Z23 Encounter for immunization: Secondary | ICD-10-CM | POA: Diagnosis not present

## 2022-11-04 DIAGNOSIS — O906 Postpartum mood disturbance: Secondary | ICD-10-CM | POA: Insufficient documentation

## 2022-11-04 DIAGNOSIS — R109 Unspecified abdominal pain: Secondary | ICD-10-CM | POA: Diagnosis not present

## 2022-11-04 DIAGNOSIS — Z113 Encounter for screening for infections with a predominantly sexual mode of transmission: Secondary | ICD-10-CM

## 2022-11-04 DIAGNOSIS — E119 Type 2 diabetes mellitus without complications: Secondary | ICD-10-CM

## 2022-11-04 DIAGNOSIS — E611 Iron deficiency: Secondary | ICD-10-CM

## 2022-11-04 DIAGNOSIS — E559 Vitamin D deficiency, unspecified: Secondary | ICD-10-CM

## 2022-11-04 DIAGNOSIS — Z862 Personal history of diseases of the blood and blood-forming organs and certain disorders involving the immune mechanism: Secondary | ICD-10-CM

## 2022-11-04 NOTE — Progress Notes (Signed)
Tollie Eth, DNP, AGNP-c [facility] [address] [place], [place] [address] Main Office [phone]   New patient visit   Patient: Yolanda Martinez   DOB: [birth year]   27 y.o. Female  MRN: [id number] Visit Date: 11/04/2022  Patient Care Team: Tollie Eth, NP as PCP - General (Nurse Practitioner)  Today's Vitals   11/04/22 1053  BP: 124/78  Pulse: 92  Weight: 185 lb 12.8 oz (84.3 kg)  Height: 5\' 3"  (1.6 m)   Body mass index is 32.91 kg/m.   Today's healthcare provider: Tollie Eth, NP   Chief Complaint  Patient presents with   other    New pt. Est., post partum needs a therapist, is diabetic, check A1C and glucose, see's endocrine on [facility], [facility]yn [facility], has not had eye exam yet this year, covid vaccine agreed to   Subjective    Yolanda Martinez is a 27 y.o. female who presents today as a new patient to establish care.    Patient endorses the following concerns presently: Diabetes She tells me she has increased hunger, sleeping a lot, and low energy. She is not checking her blood sugar at home. She is still taking the metformin 500mg  BID. She was taking 1000mg  at one time and she had constant diarrhea, this dosing is a little better, but she is still having some stomach upset.   Stressors She tells me that she is under quite a bit of stress with working two jobs and issues with her baby's father. She is not on any medications for mood and does not wish to start any at this time. She is interested in counseling services.  Flank pain She also reports left sided flank pain. She denies any urinary symptoms or vaginal symptoms.   History reviewed and reveals the following: Past Medical History:  Diagnosis Date   Anemia during pregnancy in second trimester 05/06/2022   10.6 at 26w > PO iron     History of cesarean section 06/21/2017   06/2017 op note reviewed. CD done for NRFHT. From documentation  she got to at least 6 cm prior to this.   TOLAC consent done 05/05/22     Medical history non-contributory    Supervision of other high risk pregnancy, antepartum 12/22/2021              Nursing Staff    Provider      Office Location    [facility]    Dating     08/09/2022, by Last Menstrual Period/6 wk [place]      [facility] Model    Arly.Keller ] Traditional  [ ]  Centering  [ ]  Mom-Baby Dyad    Anatomy US     Normal      Language     English                Flu Vaccine     01/21/2022    Genetic/Carrier Screen     NIPS:   low risk  AFP:   neg  Horizon: neg 4/4      TDaP Vaccine         Type 2 diabetes mellitus (HCC)    Type 2 diabetes mellitus during pregnancy 12/17/2021   Current Diabetic Medications:  Insulin  (Oral meds prior to pregnancy)  Lantus 5U QHS  Novolog 10/11/8   [x]  Aspirin 81 mg daily after 12 weeks (= A2/B GDM)     For  A2/B GDM or higher classes of DM  [x]  Diabetes Education and Testing Supplies  [x]  Nutrition Counsult  [x]  Fetal ECHO after 20 weeks--Nml  [ ]  Eye exam for retina evaluation      Baseline and surveillance labs (pulled in from [facility], ref   Vacuum-assisted vaginal delivery 07/27/2022   Vaginal Pap smear, abnormal    VBAC, delivered 07/27/2022   Past Surgical History:  Procedure Laterality Date   CESAREAN SECTION N/A 06/19/2017   Procedure: CESAREAN SECTION;  Surgeon: Tereso Newcomer, MD;  Location: [facility];  Service: Obstetrics;  Laterality: N/A;   NO PAST SURGERIES     WISDOM TOOTH EXTRACTION     Family Status  Relation Name Status   Mother  Alive   Father  Alive   Brother  (Not Specified)   Other  (Not Specified)   Neg Hx  (Not Specified)  No partnership data on file   Family History  Problem Relation Age of Onset   Hypertension Mother    Diabetes Mother    Hypertension Father    Diabetes Father    Hypertension Brother    Diabetes Brother    Hypertension Other    Cancer Other    Asthma Neg Hx    Heart disease Neg Hx    Social History   Socioeconomic  History   Marital status: Legally Separated    Spouse name: Not on file   Number of children: Not on file   Years of education: Not on file   Highest education level: Not on file  Occupational History   Not on file  Tobacco Use   Smoking status: Some Days    Types: Cigars   Smokeless tobacco: Never   Tobacco comments:    Black and milds every once and a while  Vaping Use   Vaping status: Never Used  Substance and Sexual Activity   Alcohol use: Not Currently    Comment: not since preg   Drug use: Not Currently    Types: Marijuana    Comment: Aug 2018   Sexual activity: Yes    Birth control/protection: None  Other Topics Concern   Not on file  Social History Narrative   Not on file   Social Determinants of Health   Financial Resource Strain: Low Risk  (01/15/2022)   [facility]   Overall Financial Resource Strain (CARDIA)    Difficulty of Paying Living Expenses: Not hard at all  Food Insecurity: Not on file (10/27/2022)  Transportation Needs: No Transportation Needs (07/26/2022)   PRAPARE - Administrator, Civil Service (Medical): No    Lack of Transportation (Non-Medical): No  Physical Activity: Not on File (06/12/2021)   Received from [place], [place]   Physical Activity    Physical Activity: 0  Stress: No Stress Concern Present (01/15/2022)   [facility]   [employer] of Occupational Health - Occupational Stress Questionnaire    Feeling of Stress : Not at all  Social Connections: Not on File (11/01/2022)   [facility]   Social Connections    Connectedness: 0   Outpatient Medications Prior to Visit  Medication Sig   metFORMIN (GLUCOPHAGE-XR) 500 MG 24 hr tablet Take 1 tablet (500 mg total) by mouth 2 (two) times daily with a meal.   [DISCONTINUED] Prenatal Vit-Fe Fumarate-FA (PRENATAL PO) Take 1 tablet by  mouth daily. (Patient not taking: Reported on 11/04/2022)   No facility-administered medications prior to visit.   No Known Allergies Immunization History  Administered Date(s) Administered   Influenza,inj,Quad PF,6+ Mos 01/21/2022   Moderna Sars-Covid-2 Vaccination 06/22/2019, 07/20/2019   Pfizer(Comirnaty)Fall Seasonal Vaccine 12 years and older 11/04/2022   Tdap 05/05/2022    Health Maintenance Due Health Maintenance Topics with due status: Overdue     Topic Date Due   OPHTHALMOLOGY EXAM Never done    Review of Systems All review of systems negative except what is listed in the HPI   Objective    BP 124/78   Pulse 92   Ht 5\' 3"  (1.6 m)   Wt 185 lb 12.8 oz (84.3 kg)   Breastfeeding No   BMI 32.91 kg/m  Physical Exam Vitals and nursing note reviewed.  Constitutional:      General: She is not in acute distress.    Appearance: Normal appearance. She is obese.  HENT:     Head: Normocephalic and atraumatic.     Right Ear: Hearing, tympanic membrane, ear canal and external ear normal.     Left Ear: Hearing, tympanic membrane, ear canal and external ear normal.     Nose: Nose normal.     Right Sinus: No maxillary sinus tenderness or frontal sinus tenderness.     Left Sinus: No maxillary sinus tenderness or frontal sinus tenderness.     Mouth/Throat:     Lips: Pink.     Mouth: Mucous membranes are moist.     Pharynx: Oropharynx is clear.  Eyes:     General: Lids are normal. Vision grossly intact.     Extraocular Movements: Extraocular movements intact.     Conjunctiva/sclera: Conjunctivae normal.     Pupils: Pupils are equal, round, and reactive to light.     Funduscopic exam:    Right eye: Red reflex present.        Left eye: Red reflex present.    Visual Fields: Right eye visual fields normal and left eye visual fields normal.  Neck:     Thyroid: No thyromegaly.     Vascular: No carotid bruit.  Cardiovascular:     Rate and Rhythm: Normal rate and regular  rhythm.     Chest Wall: PMI is not displaced.     Pulses: Normal pulses.          Dorsalis pedis pulses are 2+ on the right side and 2+ on the left side.       Posterior tibial pulses are 2+ on the right side and 2+ on the left side.     Heart sounds: Normal heart sounds. No murmur heard. Pulmonary:     Effort: Pulmonary effort is normal. No respiratory distress.     Breath sounds: Normal breath sounds.  Abdominal:     General: Abdomen is flat. Bowel sounds are normal. There is no distension.     Palpations: Abdomen is soft. There is no hepatomegaly, splenomegaly or mass.     Tenderness: There is no abdominal tenderness. There is no right CVA tenderness, left CVA tenderness, guarding or rebound.  Musculoskeletal:        General: Normal range of motion.     Cervical back: Full passive range of motion without pain, normal range of motion and neck supple. No tenderness.     Right lower leg: No edema.     Left lower leg: No edema.  Feet:     Left foot:  Toenail Condition: Left toenails are normal.  Lymphadenopathy:     Cervical: No cervical adenopathy.     Upper Body:     Right upper body: No supraclavicular adenopathy.     Left upper body: No supraclavicular adenopathy.  Skin:    General: Skin is warm and dry.     Capillary Refill: Capillary refill takes less than 2 seconds.     Nails: There is no clubbing.  Neurological:     General: No focal deficit present.     Mental Status: She is alert and oriented to person, place, and time.     GCS: GCS eye subscore is 4. GCS verbal subscore is 5. GCS motor subscore is 6.     Sensory: Sensation is intact.     Motor: Motor function is intact.     Coordination: Coordination is intact.     Gait: Gait is intact.     Deep Tendon Reflexes: Reflexes are normal and symmetric.  Psychiatric:        Attention and Perception: Attention normal.        Mood and Affect: Mood normal.        Speech: Speech normal.        Behavior: Behavior normal.  Behavior is cooperative.        Thought Content: Thought content normal.        Cognition and Memory: Cognition and memory normal.        Judgment: Judgment normal.     Results for orders placed or performed in visit on 11/04/22  Hemoglobin A1c  Result Value Ref Range   Hgb A1c MFr Bld 6.0 (H) 4.8 - 5.6 %   Est. average glucose Bld gHb Est-mCnc 126 mg/dL  CBC with Differential/Platelet  Result Value Ref Range   WBC 8.4 3.4 - 10.8 x10E3/uL   RBC 4.29 3.77 - 5.28 x10E6/uL   Hemoglobin 11.9 11.1 - 15.9 g/dL   Hematocrit 16.1 09.6 - 46.6 %   MCV 86 79 - 97 fL   MCH 27.7 26.6 - 33.0 pg   MCHC 32.2 31.5 - 35.7 g/dL   RDW 04.5 40.9 - 81.1 %   Platelets 284 150 - 450 x10E3/uL   Neutrophils 62 Not Estab. %   Lymphs 32 Not Estab. %   Monocytes 5 Not Estab. %   Eos 1 Not Estab. %   Basos 0 Not Estab. %   Neutrophils Absolute 5.2 1.4 - 7.0 x10E3/uL   Lymphocytes Absolute 2.7 0.7 - 3.1 x10E3/uL   Monocytes Absolute 0.4 0.1 - 0.9 x10E3/uL   EOS (ABSOLUTE) 0.0 0.0 - 0.4 x10E3/uL   Basophils Absolute 0.0 0.0 - 0.2 x10E3/uL   Immature Granulocytes 0 Not Estab. %   Immature Grans (Abs) 0.0 0.0 - 0.1 x10E3/uL  Comprehensive metabolic panel  Result Value Ref Range   Glucose 105 (H) 70 - 99 mg/dL   BUN 12 6 - 20 mg/dL   Creatinine, Ser 9.14 0.57 - 1.00 mg/dL   eGFR 782 >95 AO/ZHY/8.65   BUN/Creatinine Ratio 17 9 - 23   Sodium 143 134 - 144 mmol/L   Potassium 4.5 3.5 - 5.2 mmol/L   Chloride 108 (H) 96 - 106 mmol/L   CO2 21 20 - 29 mmol/L   Calcium 10.0 8.7 - 10.2 mg/dL   Total Protein 7.2 6.0 - 8.5 g/dL   Albumin 4.1 4.0 - 5.0 g/dL   Globulin, Total 3.1 1.5 - 4.5 g/dL   Bilirubin Total 0.5 0.0 - 1.2  mg/dL   Alkaline Phosphatase 69 44 - 121 IU/L   AST 15 0 - 40 IU/L   ALT 19 0 - 32 IU/L  VITAMIN D 25 Hydroxy (Vit-D Deficiency, Fractures)  Result Value Ref Range   Vit D, 25-Hydroxy 19.4 (L) 30.0 - 100.0 ng/mL  Vitamin B12  Result Value Ref Range   Vitamin B-12 592 232 - 1,245 pg/mL   TSH  Result Value Ref Range   TSH 0.759 0.450 - 4.500 uIU/mL  T4, free  Result Value Ref Range   Free T4 1.39 0.82 - 1.77 ng/dL  Iron, TIBC and Ferritin Panel  Result Value Ref Range   Total Iron Binding Capacity 379 250 - 450 ug/dL   UIBC 295 284 - 132 ug/dL   Iron 36 27 - 440 ug/dL   Iron Saturation 9 (LL) 15 - 55 %   Ferritin 18 15 - 150 ng/mL  STI Profile, CT/NG/TV  Result Value Ref Range   Hepatitis B Surface Ag Negative Negative   Hep B Surface Ab, Qual Reactive    Hep B Core Total Ab Negative Negative   Rfx to HBc IgM Comment    Interpretation Comment    HCV Ab Non Reactive Non Reactive   RPR Ser Ql Non Reactive Non Reactive   HIV Screen 4th Generation wRfx Non Reactive Non Reactive   Chlamydia by NAA Negative Negative   Gonococcus by NAA Negative Negative   Trich vag by NAA Negative Negative  Interpretation:  Result Value Ref Range   HCV Interp 1: Comment     Assessment & Plan      Problem List Items Addressed This Visit     Type 2 diabetes mellitus without complication, without long-term current use of insulin (HCC)    Chronic. BG  monitored today . Associated with  BMI >30 . No symptoms present today. Currently managed with metformin 500mg  BID. Foot Exam HM Status: is up to date. Urine Micro HM Status: is up to date. A1c HM Status: is up to date. CMP HM Status: is up to date. Lipids HM Status: is up to date. Eye Exam HM Status: is up to date and need records. Plan: Glucose monitoring once daily with goals for AM fasting blood sugar 70-110 and 2 hours after meals blood sugar <150.  Diet should include 150-180 grams of carbohydrates, 20-30 grams of protein, 30 grams of fiber, and no more than 15 grams of saturated fat per day.  Exercise goals should include an average of 150 minutes of moderate intensity activity per week.  Blood pressure monitoring weekly with goal <130/80. Continue diabetes medications. We can consider transition to GLP-1 to help with weight  management and blood sugars with reduction of symptoms of diarrhea, if desired.  Labs ordered. Plan to follow-up in 3months.          Relevant Orders   Hemoglobin A1c (Completed)   CBC with Differential/Platelet (Completed)   Comprehensive metabolic panel (Completed)   Postpartum mood disturbance    She reports mood concerns, primarily related to stressors associated with the relationship with her childs father. She is interested in counseling. She has no alarm symptoms present at this time. I will send referral for counseling today. If medication is desired, we can add this if she wishes.       Relevant Orders   Ambulatory referral to Psychology   TSH (Completed)   T4, free (Completed)   Encounter for annual physical exam - Primary  CPE completed today. Review of HM activities and recommendations discussed and provided on AVS. Anticipatory guidance, diet, and exercise recommendations provided. Medications, allergies, and hx reviewed and updated as necessary. Orders placed as listed below.  Plan: - Labs ordered. Will make changes as necessary based on results.  - I will review these results and send recommendations via MyChart or a telephone call.  - F/U with CPE in 1 year or sooner for acute/chronic health needs as directed.        Acute left flank pain    Etiology unknown at this time. No tenderness on palpation or associated symptoms. We will monitor labs today.       Relevant Orders   POCT URINALYSIS DIP (CLINITEK)   Other Visit Diagnoses     Need for COVID-19 vaccine       Relevant Orders   Pfizer Comirnaty Covid -19 Vaccine 27yrs and older (Completed)   History of anemia       Relevant Orders   Vitamin B12 (Completed)   Iron, TIBC and Ferritin Panel (Completed)   Vitamin D deficiency       Relevant Medications   Vitamin D, Ergocalciferol, (DRISDOL) 1.25 MG (50000 UNIT) CAPS capsule   Other Relevant Orders   VITAMIN D 25 Hydroxy (Vit-D Deficiency, Fractures)  (Completed)   Screening examination for STI       Relevant Orders   STI Profile, CT/NG/TV (Completed)   RPR   HIV Antibody (routine testing w rflx)   Iron deficiency       Relevant Medications   Iron, Ferrous Sulfate, 325 (65 Fe) MG TABS        Return in about 3 months (around 02/03/2023) for Med Management 30.      Akaya Proffit, Sung Amabile, NP, DNP, AGNP-C [facility]

## 2022-11-04 NOTE — Patient Instructions (Signed)
Postpartum Baby Blues The postpartum period begins right after the birth of a baby. During this time, there is often joy and excitement. It is also a time of many changes in the life of the parents. A mother may feel happy one minute and sad or stressed the next. These feelings of sadness, called the baby blues, usually happen in the period right after the baby is born and go away within a week or two. What are the causes? The exact cause of this condition is not known. Changes in hormone levels after childbirth are believed to trigger some of the symptoms. Other factors that can play a role in these mood changes include: Lack of sleep. Stressful life events, such as financial problems, caring for a loved one, or death of a loved one. Genetics. What are the signs or symptoms? Symptoms of this condition include: Changes in mood, such as going from extreme happiness to sadness. A decrease in concentration. Difficulty sleeping. Crying spells and tearfulness. Loss of appetite. Irritability. Anxiety. If these symptoms last for more than 2 weeks or become more severe, you may have postpartum depression. How is this diagnosed? This condition is diagnosed based on an evaluation of your symptoms. Your health care provider may use a screening tool that includes a list of questions to help identify a person with the baby blues or postpartum depression. How is this treated? The baby blues usually go away on their own in 1-2 weeks. Social support is often what is needed. You will be encouraged to get adequate sleep and rest. Follow these instructions at home: Lifestyle     Get as much rest as you can. Take a nap when the baby sleeps. Exercise regularly as told by your health care provider. Some women find yoga and walking to be helpful. Eat a balanced and nourishing diet. This includes plenty of fruits and vegetables, whole grains, and lean proteins. Do little things that you enjoy. Take a bubble  bath, read your favorite magazine, or listen to your favorite music. Avoid alcohol. Ask for help with household chores, cooking, grocery shopping, or running errands. Do not try to do everything yourself. Consider hiring a postpartum doula to help. This is a professional who specializes in providing support to new mothers. Try not to make any major life changes during pregnancy or right after giving birth. This can add stress. General instructions Talk to people close to you about how you are feeling. Get support from your partner, family members, friends, or other new moms. You may want to join a support group. Find ways to manage stress. This may include: Writing your thoughts and feelings in a journal. Spending time outside. Spending time with people who make you laugh. Try to stay positive in how you think. Think about the things you are grateful for. Take over-the-counter and prescription medicines only as told by your health care provider. Let your health care provider know if you have any concerns. Keep all postpartum visits. This is important. Contact a health care provider if: Your baby blues do not go away after 2 weeks. Get help right away if: You have thoughts of taking your own life (suicidal thoughts), or of harming your baby or someone else. You see or hear things that are not there (hallucinations). If you ever feel like you may hurt yourself or others, or have thoughts about taking your own life, get help right away. Go to your nearest emergency department or: Call your local emergency services (911  in the [place] Call a suicide crisis helpline, such as the National Suicide Prevention Lifeline, at 2081288186 or 988 in the [place] This is open 24 hours a day in the [place] Text the Crisis Text Line at 732-707-7312 (in the [place] Summary After giving birth, you may feel happy one minute and sad or stressed the next. Feelings of sadness that happen right after the baby is born and go  away after a week or two are called the baby blues. You can manage the baby blues by getting enough rest, eating a healthy diet, exercising, spending time with supportive people, and finding ways to manage stress. If feelings of sadness and stress last longer than 2 weeks or get in the way of caring for your baby, talk with your health care provider. This may mean you have postpartum depression. This information is not intended to replace advice given to you by your health care provider. Make sure you discuss any questions you have with your health care provider. Document Revised: 08/29/2020 Document Reviewed: 07/29/2019 Elsevier Patient Education  2024 [employer].

## 2022-11-05 MED ORDER — IRON (FERROUS SULFATE) 325 (65 FE) MG PO TABS
325.0000 mg | ORAL_TABLET | Freq: Every day | ORAL | 1 refills | Status: AC
Start: 2022-11-05 — End: ?

## 2022-11-05 MED ORDER — VITAMIN D (ERGOCALCIFEROL) 1.25 MG (50000 UNIT) PO CAPS
50000.0000 [IU] | ORAL_CAPSULE | ORAL | 3 refills | Status: AC
Start: 2022-11-05 — End: ?

## 2022-11-06 LAB — IRON,TIBC AND FERRITIN PANEL
Ferritin: 18 ng/mL (ref 15–150)
Iron Saturation: 9 % — CL (ref 15–55)
Iron: 36 ug/dL (ref 27–159)
Total Iron Binding Capacity: 379 ug/dL (ref 250–450)
UIBC: 343 ug/dL (ref 131–425)

## 2022-11-06 LAB — COMPREHENSIVE METABOLIC PANEL
ALT: 19 IU/L (ref 0–32)
AST: 15 IU/L (ref 0–40)
Albumin: 4.1 g/dL (ref 4.0–5.0)
Alkaline Phosphatase: 69 IU/L (ref 44–121)
BUN/Creatinine Ratio: 17 (ref 9–23)
BUN: 12 mg/dL (ref 6–20)
Bilirubin Total: 0.5 mg/dL (ref 0.0–1.2)
CO2: 21 mmol/L (ref 20–29)
Calcium: 10 mg/dL (ref 8.7–10.2)
Chloride: 108 mmol/L — ABNORMAL HIGH (ref 96–106)
Creatinine, Ser: 0.7 mg/dL (ref 0.57–1.00)
Globulin, Total: 3.1 g/dL (ref 1.5–4.5)
Glucose: 105 mg/dL — ABNORMAL HIGH (ref 70–99)
Potassium: 4.5 mmol/L (ref 3.5–5.2)
Sodium: 143 mmol/L (ref 134–144)
Total Protein: 7.2 g/dL (ref 6.0–8.5)
eGFR: 121 mL/min/{1.73_m2} (ref >59)

## 2022-11-06 LAB — CBC WITH DIFFERENTIAL/PLATELET
Basophils Absolute: 0 10*3/uL (ref 0.0–0.2)
Basos: 0 %
EOS (ABSOLUTE): 0 10*3/uL (ref 0.0–0.4)
Eos: 1 %
Hematocrit: 37 % (ref 34.0–46.6)
Hemoglobin: 11.9 g/dL (ref 11.1–15.9)
Immature Grans (Abs): 0 10*3/uL (ref 0.0–0.1)
Immature Granulocytes: 0 %
Lymphocytes Absolute: 2.7 10*3/uL (ref 0.7–3.1)
Lymphs: 32 %
MCH: 27.7 pg (ref 26.6–33.0)
MCHC: 32.2 g/dL (ref 31.5–35.7)
MCV: 86 fL (ref 79–97)
Monocytes Absolute: 0.4 10*3/uL (ref 0.1–0.9)
Monocytes: 5 %
Neutrophils Absolute: 5.2 10*3/uL (ref 1.4–7.0)
Neutrophils: 62 %
Platelets: 284 10*3/uL (ref 150–450)
RBC: 4.29 x10E6/uL (ref 3.77–5.28)
RDW: 15.2 % (ref 11.7–15.4)
WBC: 8.4 10*3/uL (ref 3.4–10.8)

## 2022-11-06 LAB — STI PROFILE, CT/NG/TV
Chlamydia by NAA: NEGATIVE
Gonococcus by NAA: NEGATIVE
HCV Ab: NONREACTIVE
HIV Screen 4th Generation wRfx: NONREACTIVE
Hep B Core Total Ab: NEGATIVE
Hep B Surface Ab, Qual: REACTIVE
Hepatitis B Surface Ag: NEGATIVE
RPR Ser Ql: NONREACTIVE
Trich vag by NAA: NEGATIVE

## 2022-11-06 LAB — VITAMIN B12: Vitamin B-12: 592 pg/mL (ref 232–1245)

## 2022-11-06 LAB — HEMOGLOBIN A1C
Est. average glucose Bld gHb Est-mCnc: 126 mg/dL
Hgb A1c MFr Bld: 6 % — ABNORMAL HIGH (ref 4.8–5.6)

## 2022-11-06 LAB — VITAMIN D 25 HYDROXY (VIT D DEFICIENCY, FRACTURES): Vit D, 25-Hydroxy: 19.4 ng/mL — ABNORMAL LOW (ref 30.0–100.0)

## 2022-11-06 LAB — T4, FREE: Free T4: 1.39 ng/dL (ref 0.82–1.77)

## 2022-11-06 LAB — HCV INTERPRETATION

## 2022-11-06 LAB — TSH: TSH: 0.759 u[IU]/mL (ref 0.450–4.500)

## 2022-11-10 DIAGNOSIS — Z0000 Encounter for general adult medical examination without abnormal findings: Secondary | ICD-10-CM | POA: Insufficient documentation

## 2022-11-10 DIAGNOSIS — R109 Unspecified abdominal pain: Secondary | ICD-10-CM | POA: Insufficient documentation

## 2022-11-10 LAB — POCT URINALYSIS DIP (CLINITEK)
Bilirubin, UA: NEGATIVE
Blood, UA: NEGATIVE
Glucose, UA: NEGATIVE mg/dL
Ketones, POC UA: NEGATIVE mg/dL
Leukocytes, UA: NEGATIVE
Nitrite, UA: NEGATIVE
POC PROTEIN,UA: NEGATIVE
Spec Grav, UA: 1.015 (ref 1.010–1.025)
Urobilinogen, UA: 0.2 E.U./dL
pH, UA: 6.5 (ref 5.0–8.0)

## 2022-11-10 NOTE — Assessment & Plan Note (Signed)
She reports mood concerns, primarily related to stressors associated with the relationship with her childs father. She is interested in counseling. She has no alarm symptoms present at this time. I will send referral for counseling today. If medication is desired, we can add this if she wishes.

## 2022-11-10 NOTE — Assessment & Plan Note (Addendum)
Chronic. BG  monitored today . Associated with  BMI >30 . No symptoms present today. Currently managed with metformin 500mg  BID. Foot Exam HM Status: is up to date. Urine Micro HM Status: is up to date. A1c HM Status: is up to date. CMP HM Status: is up to date. Lipids HM Status: is up to date. Eye Exam HM Status: is up to date and need records. Plan: Glucose monitoring once daily with goals for AM fasting blood sugar 70-110 and 2 hours after meals blood sugar <150.  Diet should include 150-180 grams of carbohydrates, 20-30 grams of protein, 30 grams of fiber, and no more than 15 grams of saturated fat per day.  Exercise goals should include an average of 150 minutes of moderate intensity activity per week.  Blood pressure monitoring weekly with goal <130/80. Continue diabetes medications. We can consider transition to GLP-1 to help with weight management and blood sugars with reduction of symptoms of diarrhea, if desired.  Labs ordered. Plan to follow-up in 3months.

## 2022-11-10 NOTE — Assessment & Plan Note (Signed)
Etiology unknown at this time. No tenderness on palpation or associated symptoms. We will monitor labs today.

## 2022-11-10 NOTE — Assessment & Plan Note (Signed)

## 2023-02-03 ENCOUNTER — Encounter: Payer: BC Managed Care – PPO | Admitting: Nurse Practitioner

## 2023-03-02 ENCOUNTER — Ambulatory Visit: Payer: BC Managed Care – PPO | Admitting: Dietician

## 2023-03-02 ENCOUNTER — Ambulatory Visit: Payer: BC Managed Care – PPO | Admitting: "Endocrinology
